# Patient Record
Sex: Female | Born: 1960 | Race: White | Hispanic: No | Marital: Married | Smoking: Never smoker
Health system: Southern US, Community
[De-identification: ages and names within clinical notes are randomized; demographics above are authoritative.]

## PROBLEM LIST (undated history)

## (undated) DIAGNOSIS — M2040 Other hammer toe(s) (acquired), unspecified foot: Secondary | ICD-10-CM

## (undated) DIAGNOSIS — J45909 Unspecified asthma, uncomplicated: Secondary | ICD-10-CM

## (undated) DIAGNOSIS — K219 Gastro-esophageal reflux disease without esophagitis: Secondary | ICD-10-CM

## (undated) DIAGNOSIS — E039 Hypothyroidism, unspecified: Secondary | ICD-10-CM

## (undated) DIAGNOSIS — M21619 Bunion of unspecified foot: Secondary | ICD-10-CM

## (undated) DIAGNOSIS — M1990 Unspecified osteoarthritis, unspecified site: Secondary | ICD-10-CM

## (undated) DIAGNOSIS — C801 Malignant (primary) neoplasm, unspecified: Secondary | ICD-10-CM

## (undated) HISTORY — PX: TOTAL KNEE ARTHROPLASTY: SHX125

## (undated) HISTORY — DX: Malignant (primary) neoplasm, unspecified: C80.1

## (undated) HISTORY — PX: BUNIONECTOMY WITH HAMMERTOE RECONSTRUCTION: SHX5600

## (undated) HISTORY — PX: ROTATOR CUFF REPAIR: SHX139

## (undated) HISTORY — PX: POLYPECTOMY: SHX149

## (undated) HISTORY — DX: Other hammer toe(s) (acquired), unspecified foot: M20.40

## (undated) HISTORY — DX: Gastro-esophageal reflux disease without esophagitis: K21.9

## (undated) HISTORY — PX: GREAT TOE ARTHRODESIS, INTERPHALANGEAL JOINT: SUR55

## (undated) HISTORY — PX: NECK SURGERY: SHX720

## (undated) HISTORY — DX: Unspecified osteoarthritis, unspecified site: M19.90

## (undated) HISTORY — PX: NASAL SINUS SURGERY: SHX719

## (undated) HISTORY — DX: Unspecified asthma, uncomplicated: J45.909

## (undated) HISTORY — DX: Bunion of unspecified foot: M21.619

## (undated) HISTORY — DX: Hypothyroidism, unspecified: E03.9

---

## 1997-11-03 ENCOUNTER — Other Ambulatory Visit: Admission: RE | Admit: 1997-11-03 | Discharge: 1997-11-03 | Payer: Self-pay | Admitting: Obstetrics and Gynecology

## 1998-11-09 ENCOUNTER — Other Ambulatory Visit: Admission: RE | Admit: 1998-11-09 | Discharge: 1998-11-09 | Payer: Self-pay | Admitting: Obstetrics and Gynecology

## 1999-05-01 ENCOUNTER — Other Ambulatory Visit: Admission: RE | Admit: 1999-05-01 | Discharge: 1999-05-01 | Payer: Self-pay | Admitting: Obstetrics and Gynecology

## 1999-07-30 ENCOUNTER — Encounter (INDEPENDENT_AMBULATORY_CARE_PROVIDER_SITE_OTHER): Payer: Self-pay

## 1999-07-30 ENCOUNTER — Other Ambulatory Visit: Admission: RE | Admit: 1999-07-30 | Discharge: 1999-07-30 | Payer: Self-pay | Admitting: Plastic Surgery

## 1999-11-21 ENCOUNTER — Emergency Department (HOSPITAL_COMMUNITY): Admission: EM | Admit: 1999-11-21 | Discharge: 1999-11-21 | Payer: Self-pay | Admitting: Emergency Medicine

## 2000-01-22 ENCOUNTER — Other Ambulatory Visit: Admission: RE | Admit: 2000-01-22 | Discharge: 2000-01-22 | Payer: Self-pay | Admitting: Obstetrics and Gynecology

## 2000-07-23 ENCOUNTER — Other Ambulatory Visit: Admission: RE | Admit: 2000-07-23 | Discharge: 2000-07-23 | Payer: Self-pay | Admitting: Obstetrics and Gynecology

## 2001-08-12 ENCOUNTER — Other Ambulatory Visit: Admission: RE | Admit: 2001-08-12 | Discharge: 2001-08-12 | Payer: Self-pay | Admitting: Obstetrics and Gynecology

## 2002-06-18 ENCOUNTER — Encounter: Admission: RE | Admit: 2002-06-18 | Discharge: 2002-06-18 | Payer: Self-pay | Admitting: Family Medicine

## 2002-06-18 ENCOUNTER — Encounter: Payer: Self-pay | Admitting: Family Medicine

## 2002-08-25 ENCOUNTER — Other Ambulatory Visit: Admission: RE | Admit: 2002-08-25 | Discharge: 2002-08-25 | Payer: Self-pay | Admitting: Obstetrics and Gynecology

## 2003-09-08 ENCOUNTER — Other Ambulatory Visit: Admission: RE | Admit: 2003-09-08 | Discharge: 2003-09-08 | Payer: Self-pay | Admitting: Obstetrics and Gynecology

## 2004-11-23 ENCOUNTER — Other Ambulatory Visit: Admission: RE | Admit: 2004-11-23 | Discharge: 2004-11-23 | Payer: Self-pay | Admitting: Obstetrics and Gynecology

## 2006-02-10 ENCOUNTER — Ambulatory Visit: Payer: Self-pay | Admitting: Internal Medicine

## 2006-04-08 ENCOUNTER — Encounter (INDEPENDENT_AMBULATORY_CARE_PROVIDER_SITE_OTHER): Payer: Self-pay | Admitting: Specialist

## 2006-04-08 ENCOUNTER — Ambulatory Visit: Payer: Self-pay | Admitting: Internal Medicine

## 2006-04-25 ENCOUNTER — Inpatient Hospital Stay (HOSPITAL_COMMUNITY): Admission: AD | Admit: 2006-04-25 | Discharge: 2006-04-25 | Payer: Self-pay | Admitting: Obstetrics and Gynecology

## 2007-04-28 ENCOUNTER — Ambulatory Visit (HOSPITAL_COMMUNITY): Admission: RE | Admit: 2007-04-28 | Discharge: 2007-04-29 | Payer: Self-pay | Admitting: Neurosurgery

## 2010-10-09 NOTE — Op Note (Signed)
NAMECALEB, PRIGMORE                ACCOUNT NO.:  [id number]   MEDICAL RECORD NO.:  [id number]          PATIENT TYPE:  OIB   LOCATION:  3025                         FACILITY:  [facility]   PHYSICIAN:  Danae Orleans. Venetia Maxon, M.D.  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   PROCEDURE:  Anterior cervical decompression and fusion, C5-6, with  allograft autograft and anterior cervical plate.   SURGEON:  Danae Orleans. Venetia Maxon, MD   ASSISTANT:  Georgiann Cocker, RN   ANESTHESIA:  General endotracheal anesthesia.   ESTIMATED BLOOD LOSS:  Minimal.   COMPLICATIONS:  None.   DISPOSITION:  To Recovery.   INDICATIONS:  [name] is a 50 year old woman with herniated  cervical disk at C5-6 with cervical spondylosis and stenosis with right  greater than left upper extremity pain.  It was elected to take her to  surgery for anterior cervical decompression and fusion at the C5-6  level.   PROCEDURE:  [name] was brought to the operating room.  Following the  satisfactory and uncomplicated induction of general endotracheal  anesthesia and placement of intravenous lines, the patient was placed in  supine position on the operating table.  Her neck was placed in slight  extension.  She was placed in 5 pounds of halter traction.  Her anterior  neck was then prepped and draped in the usual sterile fashion.  Area of  planned incision was infiltrated with 0.25% Marcaine and 0.5% lidocaine  with 1:200,0000 epinephrine.  Incision was made from the midline to the  anterior border of the sternocleidomastoid muscle on the left side of  midline just above the carotid tubercle and carried through platysmal  layer.   Subplatysmal dissection was performed, exposing the anterior  border of the sternocleidomastoid muscle using blunt dissection.  The  carotid sheath was kept lateral, trachea and esophagus kept medial,  exposing the anterior cervical spine.  A bent spinal needle was placed  at what was felt to be the C5-6 level and this was confirmed on  intraoperative x-ray.  The longus colli muscles were then taken down  from the anterior cervical spine from C5 to C6 bilaterally using  electrocautery and Key elevator and self-retaining Shadowline retractor  was placed to facilitate exposure.  The interspace was incised with a 15  blade and disk material was removed in a piecemeal fashion and ventral  osteophytes were also removed with a Leksell rongeur.  Distraction pins  were placed at C5 and C6 and using gentle distraction, the interspace  was reopened and endplates were stripped of residual disk material and  using a high-speed drill with suction trap to retain bony fragments, the  endplates were decorticated and also uncinate spurs were drilled down.  The microscope was brought into field and using microdissection  technique, the uncinate spurs were removed.  The spinal cord dura was  decompressed; both C6 nerve roots were decompressed as they extended out  the neural foramina.  Hemostasis was assured with Gelfoam soaked in  thrombin and after trial sizing, a 7-mm bone autograft wedge was  selected and fashioned with a high-speed drill and packed with the  morselized bone autograft, which had been retained at the time of  diskectomy and endplate preparation.  This was inserted in the  interspace and countersunk appropriately.  The distraction pins were  removed.  Traction weight was removed.  The 14-mm Trestle anterior  cervical plate was then affixed to the anterior cervical spine using  variable-angle 14-mm screws, 2 at C5 and 2 at C6; all screws had  excellent purchase.  Locking mechanisms were  engaged.  Final x-ray  demonstrated a well-positioned interbody graft and anterior cervical  plate without complicating features.  Hemostasis was assured.  Soft  tissues were inspected and found to be in good repair.  The platysma  layer was closed with 3-0 Vicryl sutures.  The skin edges were  approximated with 3-0 Vicryl interrupted and inverted sutures.  Wound  was dressed with Dermabond.  The patient was extubated in the operating  room and taken to the recovery room, having tolerated the procedure  well.      Danae Orleans. Venetia Maxon, M.D.  Electronically Signed     JDS/MEDQ  D:  04/28/2007  T:  04/29/2007  Job:  [id number]

## 2010-10-12 NOTE — Assessment & Plan Note (Signed)
[facility]                           GASTROENTEROLOGY OFFICE NOTE   JAMANI, ELEY                         MRN:          [id number]  DATE:02/10/2006                            DOB:          01/01/1961    HISTORY OF PRESENT ILLNESS:  Mrs. Ohms is a 50 year old white female here  at the recommendation of Dr. Arelia Sneddon for evaluation of rectal bleeding.  She  had onset of the bleeding about a year ago when becoming constipated which  lasted several months.  It finally subsided in January of this year.  She  just recently returned from a trip to [place] where she developed diarrhea,  and again took one Imodium which caused constipation.  Again, rectal  bleeding recurred.  It is bright red blood associated with fullness and  sometimes a nocturnal itching.  Usual bowel habits are quite irregular  because her eating habits have been irregular as well.  Since return from  [place] several days ago, the patient has been under a great deal of stress  and has not been eating and lost about 5 pounds.  There is no family history  of colon cancer.   MEDICATIONS:  Synthroid 1.25 mg p.o. daily.   PAST MEDICAL HISTORY:  Thyroid problems.   PAST SURGICAL HISTORY:  Knee and shoulder surgeries.   FAMILY HISTORY:  Positive for hyperthyroidism in women in her family.   SOCIAL HISTORY:  Married, having two children, one with AT and one adopted.  The patient is a homemaker.  She drinks alcohol socially.  Does not smoke.   REVIEW OF SYSTEMS:  Weight has dropped about 5 pounds in the last week.  Sleeping problems.  __________ change.  Itching.  New headaches.   PHYSICAL EXAMINATION:  VITAL SIGNS:  Blood pressure 110/74, pulse 84, weight  137 pounds, usual weight was 135 pounds.  She went up to 140 pounds before  her trip.  GENERAL:  She was alert and oriented in no distress.  SKIN: Warm and dry.  LUNGS:  Clear to auscultation.  COR:  Normal S1, S2.  ABDOMEN:  Soft  with good muscle support.  Normoactive bowel sounds.  No  tenderness.  No distention.  Right lower quadrant and left lower quadrant  were unremarkable.  RECTAL ANOSCOPIC:  Normal perianal area.  Rectal tone was normal.  There  were at least three mixed hemorrhoids within the external and internal area  right in the anal canal which were erythematous and somewhat edematous as  well, but there was no active bleeding.  Stool in the rectum appeared both  soft and hemoccult negative.  There was no evidence of proctitis and no  mucous.   IMPRESSION:  1. A 50 year old white female with first grade mixed hemorrhoids, possible      source of bleeding, although, not documented on today's endoscopic      exam.  2. Rectal bleeding, could possibly be related to hemorrhoids, but again,      it could be related to a sigmoid colon lesion.  Judging from the color  of the stool and the fact that the patient has not been anemic by      account, feel that this is most likely a distal colon occurrence rather      than a proximal colon problem.   PLAN:  I will treat the hemorrhoids with Anusol suppositories and Analpram  HC.  In view of her age of 50, and recurrence of rectal bleeding, I would  recommend full colonoscopic exam.  This will be done after the treatment of  hemorrhoids.  She will prep with MiraLax prep.  We have discussed culture  sedation as well as the procedure itself.                                   Hedwig Morton. Juanda Chance, MD   DMB/MedQ  DD:  02/10/2006  DT:  02/11/2006  Job #:  [id number]   cc:   Juluis Mire, M.D.  Stacie Acres Cliffton Asters, M.D.

## 2010-10-23 ENCOUNTER — Other Ambulatory Visit: Payer: Self-pay | Admitting: Family Medicine

## 2010-10-23 ENCOUNTER — Ambulatory Visit
Admission: RE | Admit: 2010-10-23 | Discharge: 2010-10-23 | Disposition: A | Discharge status: Home / Self Care | Payer: BC Managed Care – PPO | Source: Ambulatory Visit | Attending: Family Medicine | Admitting: Family Medicine

## 2010-10-23 DIAGNOSIS — M79669 Pain in unspecified lower leg: Secondary | ICD-10-CM

## 2010-10-24 ENCOUNTER — Encounter: Payer: Self-pay | Admitting: Podiatry

## 2011-02-06 ENCOUNTER — Other Ambulatory Visit: Payer: Self-pay | Admitting: Dermatology

## 2011-02-15 ENCOUNTER — Other Ambulatory Visit: Payer: Self-pay | Admitting: Orthopedic Surgery

## 2011-02-15 ENCOUNTER — Encounter (HOSPITAL_COMMUNITY): Payer: BC Managed Care – PPO

## 2011-02-15 LAB — APTT: aPTT: 27 seconds (ref 24–37)

## 2011-02-15 LAB — URINALYSIS, ROUTINE W REFLEX MICROSCOPIC
Nitrite: NEGATIVE
Specific Gravity, Urine: 1.025 (ref 1.005–1.030)
Urobilinogen, UA: 0.2 mg/dL (ref 0.0–1.0)
pH: 5.5 (ref 5.0–8.0)

## 2011-02-15 LAB — CBC
MCH: 32.2 pg (ref 26.0–34.0)
MCHC: 34.3 g/dL (ref 30.0–36.0)
Platelets: 246 10*3/uL (ref 150–400)
RDW: 13 % (ref 11.5–15.5)

## 2011-02-15 LAB — COMPREHENSIVE METABOLIC PANEL
ALT: 14 U/L (ref 0–35)
AST: 17 U/L (ref 0–37)
Calcium: 10 mg/dL (ref 8.4–10.5)
GFR calc Af Amer: >60 mL/min (ref >60)
Sodium: 137 mEq/L (ref 135–145)
Total Protein: 7.5 g/dL (ref 6.0–8.3)

## 2011-02-15 LAB — SURGICAL PCR SCREEN: MRSA, PCR: NEGATIVE

## 2011-02-15 LAB — URINE MICROSCOPIC-ADD ON

## 2011-02-20 ENCOUNTER — Inpatient Hospital Stay (HOSPITAL_COMMUNITY)
Admission: RE | Admit: 2011-02-20 | Discharge: 2011-02-27 | DRG: 209 | Disposition: A | Discharge status: Home Health | Payer: BC Managed Care – PPO | Source: Ambulatory Visit | Attending: Orthopedic Surgery | Admitting: Orthopedic Surgery

## 2011-02-20 DIAGNOSIS — E039 Hypothyroidism, unspecified: Secondary | ICD-10-CM | POA: Diagnosis present

## 2011-02-20 DIAGNOSIS — D62 Acute posthemorrhagic anemia: Secondary | ICD-10-CM | POA: Diagnosis not present

## 2011-02-20 DIAGNOSIS — Z01812 Encounter for preprocedural laboratory examination: Secondary | ICD-10-CM

## 2011-02-20 DIAGNOSIS — Z87440 Personal history of urinary (tract) infections: Secondary | ICD-10-CM

## 2011-02-20 DIAGNOSIS — M76899 Other specified enthesopathies of unspecified lower limb, excluding foot: Secondary | ICD-10-CM | POA: Diagnosis present

## 2011-02-20 DIAGNOSIS — M62838 Other muscle spasm: Secondary | ICD-10-CM | POA: Diagnosis not present

## 2011-02-20 DIAGNOSIS — Z742 Need for assistance at home and no other household member able to render care: Secondary | ICD-10-CM

## 2011-02-20 DIAGNOSIS — N390 Urinary tract infection, site not specified: Secondary | ICD-10-CM | POA: Diagnosis present

## 2011-02-20 DIAGNOSIS — M1710 Unilateral primary osteoarthritis, unspecified knee: Principal | ICD-10-CM | POA: Diagnosis present

## 2011-02-20 LAB — ABO/RH: ABO/RH(D): B POS

## 2011-02-20 LAB — TYPE AND SCREEN
ABO/RH(D): B POS
Antibody Screen: NEGATIVE

## 2011-02-21 DIAGNOSIS — M1710 Unilateral primary osteoarthritis, unspecified knee: Secondary | ICD-10-CM

## 2011-02-21 DIAGNOSIS — Z96659 Presence of unspecified artificial knee joint: Secondary | ICD-10-CM

## 2011-02-21 LAB — CBC
HCT: 31.6 % — ABNORMAL LOW (ref 36.0–46.0)
Hemoglobin: 10.8 g/dL — ABNORMAL LOW (ref 12.0–15.0)
MCV: 93.2 fL (ref 78.0–100.0)
RBC: 3.39 MIL/uL — ABNORMAL LOW (ref 3.87–5.11)
RDW: 13.1 % (ref 11.5–15.5)
WBC: 9.3 10*3/uL (ref 4.0–10.5)

## 2011-02-21 LAB — BASIC METABOLIC PANEL
BUN: 4 mg/dL — ABNORMAL LOW (ref 6–23)
CO2: 29 mEq/L (ref 19–32)
Chloride: 103 mEq/L (ref 96–112)
Creatinine, Ser: 0.49 mg/dL — ABNORMAL LOW (ref 0.50–1.10)
GFR calc Af Amer: >60 mL/min (ref >60)
Potassium: 4.1 mEq/L (ref 3.5–5.1)

## 2011-02-22 LAB — CBC
HCT: 28.5 % — ABNORMAL LOW (ref 36.0–46.0)
Hemoglobin: 9.6 g/dL — ABNORMAL LOW (ref 12.0–15.0)
MCHC: 33.7 g/dL (ref 30.0–36.0)
MCV: 94.7 fL (ref 78.0–100.0)
RDW: 13.1 % (ref 11.5–15.5)
WBC: 8.7 10*3/uL (ref 4.0–10.5)

## 2011-02-22 LAB — BASIC METABOLIC PANEL
BUN: 5 mg/dL — ABNORMAL LOW (ref 6–23)
Chloride: 101 mEq/L (ref 96–112)
Creatinine, Ser: 0.52 mg/dL (ref 0.50–1.10)
Glucose, Bld: 105 mg/dL — ABNORMAL HIGH (ref 70–99)
Potassium: 3.7 mEq/L (ref 3.5–5.1)

## 2011-02-23 LAB — CBC
HCT: 29.5 % — ABNORMAL LOW (ref 36.0–46.0)
Hemoglobin: 10 g/dL — ABNORMAL LOW (ref 12.0–15.0)
MCH: 31.9 pg (ref 26.0–34.0)
MCHC: 33.9 g/dL (ref 30.0–36.0)
RDW: 13 % (ref 11.5–15.5)

## 2011-03-05 LAB — BASIC METABOLIC PANEL
BUN: 11
CO2: 25
Calcium: 8.9
Creatinine, Ser: 0.65
GFR calc non Af Amer: >60
Glucose, Bld: 87

## 2011-03-05 LAB — CBC
MCHC: 34
Platelets: 237
RDW: 13.4

## 2011-03-06 NOTE — Discharge Summary (Signed)
[name], [name]                ACCOUNT NO.:  [id number]  MEDICAL RECORD NO.:  [id number]  LOCATION:  1620                         FACILITY:  [facility]  PHYSICIAN:  Ollen Gross, M.D.    DATE OF BIRTH:  [birth year]  DATE OF ADMISSION:  02/20/2011 DATE OF DISCHARGE:  02/26/2011                        DISCHARGE SUMMARY - REFERRING   ADMITTING DIAGNOSES: 1. Osteoarthritis, right knee. 2. Hypothyroidism. 3. Past history of urinary tract infections.  DISCHARGE DIAGNOSES: 1. Osteoarthritis, right knee, status post right total knee     replacement arthroplasty. 2. Mild postoperative acute blood loss anemia, did not require     transfusion. 3. Hypothyroidism. 4. Past history of urinary tract infections.  PROCEDURE:  February 20, 2011, right total knee; surgeon Dr. Lequita Halt; assistant  Avel Peace PA-C;  spinal anesthesia; tourniquet time 45 minutes.  CONSULTS:  [facility].  BRIEF HISTORY:  The patient is a 50 year old female with advanced arthritis of the right knee, progressive worsening pain and dysfunction. She had a previous knee scope and also previous injections multiple times.  She has intractable pain limiting the ability to carry on daily function.  She has bone-on-bone throughout and now presents for total knee.  LABORATORY DATA:  Preop CBC showed hemoglobin of 14.1, hematocrit of 41.4, white cell count 7.8, platelets 246.  PT/INR 13.2 and 0.98 with a PTT of 27.  Chem panel on admission all within normal limits.  Preop UA showed trace ketones, trace blood, small leukocytes, mini squamous, 3-6 white cells, 3-6 red cells, few bacteria.  Blood group type B+.  Nasal swabs were positive for Staph aureus, but negative for MRSA.  Serial CBCs were followed.  Hemoglobin dropped down to 10.8, got as low as 9.6, however stabilized, came back up a little bit.  Last H and H 10.0 and 29.5.  Serial BMETs were followed for 48 hours.  Electrolytes  remained within normal limits.  EKG dated December 19, 2010:  Sinus bradycardia, normal P-axis, heart rate 51, negative precordial T-waves; within normal limits.  HOSPITAL COURSE:  The patient admitted to [facility], taken to OR, underwent above-stated procedure without complication.  The patient tolerated the procedure well, later transferred to recovery room in orthopedic floor, started on p.o. and IV analgesic for pain control following surgery, given 24 hours of postop IV antibiotics.  She was started on Xarelto for DVT prophylaxis.  She had originally looked into going to some type of inpatient rehab facility following her hospital course.  She had spoken with her own personal insurance company preoperatively and they told her personally that the only facility at that time they would approve would be the [facility], so we got a consult for them immediately postoperatively.  They did see her.  On postop day 1, felt that she was already progressing pretty well from rehab potential.  We also got the social work involved just in case if inpatient rehab proved to be not an option.  She was initially placed on p.o. and IV analgesics, weaned over to p.o. medications.  By day 2, she was having moderate to severe pain.  Her pain increased.  This was due to  the fact that she did a fair amount of walking on day 1 with increased pain.  We did check the dressing and changed it.  On day 2, her incision was looking excellent, hemoglobin was down to 9.6 but she was asymptomatic as far as her blood pressure and pulse.  There is mainly pain control issues.  By day 3 and on the weekend on Saturday, she was doing well and originally, she was set up to go to [facility], but due to insurance issues, she was declined even though they said initially preop that they would approve since she was progressing with her therapy.  Due to extenuating circumstances though she was  not able to go home, her husband is currently being worked up and treated for at this time an inoperable pancreatic cancer, but may require surgery if it becomes operable.  She does not have support and cannot go home to an independent situation yet.  Therefore, we kept her in over the weekend on Saturday and Sunday.  She received therapy each day and was followed by the weekend orthopedic covering services covering for Dr. Lequita Halt.  She was seen back on rounds on Monday on October 1.  She was still having a fair amount of pain in the knee and also up in the thigh, which was from the tourniquet during surgery.  We are still waiting on see if there was reconsideration of the inpatient rehab and we also had started looking into other skilled facilities. Again, she was not complete 100% and not able to go home to a good support with extenuating circumstances of her husband.  She was kept in and she was seen on rounds on October 2 by Dr. Lequita Halt, which was postop day 6.  Incision was healing well.  We are still waiting on insurance approval for reconsideration due to the extenuating circumstances and also her pending disposition.  It did not appear that the [facility] inpatient rehab will be an option, so we are looking into other skilled facilities.  DISCHARGE/PLAN:  Possible discharge day on February 26, 2011, pending insurance approval due to extenuating circumstances of family support.  DISCHARGE DIAGNOSES:  Please see above.  DISCHARGE MEDICATIONS:  Current medications at time of dictation include: 1. Levothyroxine 125 mcg p.o. q.a.m. 2. Xarelto 10 mg p.o. daily for 15 more days, then discontinue the     Xarelto. 3. Colace 100 mg p.o. b.i.d. 4. Nu-Iron 150 mg p.o. daily for 3 weeks and discontinue the Nu-Iron. 5. Valtrex 500 mg p.o. daily p.r.n. blisters. 6. Robaxin 500 mg p.o. q.6-8 hours p.r.n. spasm. 7. Restoril 15-30 mg p.o. q.h.s. p.r.n. sleep. 8. OxyIR 5 mg one or two every 4 hours  as needed for moderate pain. 9. Tylenol 325 one or two every 4-6 hours as needed for mild pain,     temperature, or headache.  DIET:  As tolerated.  ACTIVITY:  She is weightbearing as tolerated, total knee protocol.  PT and OT for gait training, ambulation, ADLs, range of motion and strengthening exercises.  Please note she may start showering, however, do not submerge the incision under water.  FOLLOWUP:  She needs followup with Dr. Lequita Halt in approximately 2 weeks from date of surgery.  Please contact the office at [phone] to help arrange appointment for followup care of this patient.  DISPOSITION:  Pending at time of dictation.  CONDITION UPON DISCHARGE:  She is improving at the time of dictation.     Alexzandrew L. Julien Girt,  P.A.C.   ______________________________ Ollen Gross, M.D.    ALP/MEDQ  D:  02/26/2011  T:  02/26/2011  Job:  [id number]  cc:   Stacie Acres. Cliffton Asters, M.D. Fax: [phone]  Skilled Nursing Facility  Electronically Signed by Patrica Duel P.A.C. on 02/28/2011 10:58:07 AM Electronically Signed by Ollen Gross M.D. on 03/06/2011 11:19:25 AM

## 2011-03-06 NOTE — Op Note (Signed)
[name], Shelly NO.:  [name]  MEDICAL RECORD NO.:  [id number]  LOCATION:  1616                         FACILITY:  [facility]  PHYSICIAN:  Ollen Gross, M.D.    DATE OF BIRTH:  March 05, 1961  DATE OF PROCEDURE:  02/20/2011 DATE OF DISCHARGE:                              OPERATIVE REPORT   PREOPERATIVE DIAGNOSIS:  Osteoarthritis right knee.  POSTOPERATIVE DIAGNOSIS:  Osteoarthritis right knee.  PROCEDURE:  Right total knee arthroplasty.  SURGEON:  Ollen Gross, M.D.  ASSISTANT:  Alexzandrew L. Perkins, P.A.C.  ANESTHESIA:  Spinal.  ESTIMATED BLOOD LOSS:  Minimal.  DRAINS:  Hemovac x1.  TOURNIQUET TIME:  45 minutes at 300 mmHg.  COMPLICATIONS:  None.  CONDITION:  Stable to Recovery.  INDICATIONS:  [name] is a 50 year old female who has advanced end-stage arthritis of the right knee with progressively worsening pain and dysfunction.  She has had a previous arthroscopy as well as previous injections and multiple times.  Unfortunately, pain is intractable now and is limiting her ability to do things.  She has bone-on-bone changes throughout the knee.  She presents now for right total knee arthroplasty.  PROCEDURE IN DETAIL:  After successful administration of spinal anesthetic, a tourniquet was placed high on her right thigh and her right lower extremity, was prepped and draped in usual sterile fashion. Extremities wrapped in Esmarch, knee flexed, tourniquet inflated to 300 mmHg.  Midline incision was made with a 10 blade through a subcutaneous tissue to the level of the extensor mechanism.  A fresh blade was used make a medial parapatellar arthrotomy.  Soft tissue on the proximal medial tibia subperiosteally elevated to the joint line with the knife into the semimembranosus bursa with a Cobb elevator.  Soft tissue laterally is elevated with attention being paid to avoid patellar tendon on tibial tubercle.  The patella was everted, knee flexed  to 90 degrees and ACL and PCL removed.  Drill was used to create a starting hole in the distal femur and the canal was thoroughly irrigated.  She has bone- on-bone change in the mediolateral and patellofemoral compartments.  The 5-degree right valgus alignment guide was then placed into the femoral canal and distal femoral cutting block is pinned to remove a 11 mm off the distal femur.  Resection was made with an oscillating saw.  It took 11 because of a preop flexion and contracture.  The tibia was then subluxed forward and the menisci removed. Extramedullary tibial alignment guide was placed referencing proximally to the medial aspect of the tibial tubercle and distally along the second metatarsal axis and tibial crest.  The block was pinned to remove 2 mm off the more deficient medial side.  Tibial resection was made with an oscillating saw.  Size #3 is the most appropriate tibial component and proximal tibia was prepared to a modular drill and keel punch for the size #3.  Femoral sizing guide was placed and size #4 and narrow was most appropriate for the femur.  The rotation was marked the epicondylar axis and confirmed by creating rectangular flexion gap at 90 degrees.  The block is pinned in this rotation and the anteroposterior and chamfer cuts  were made.  The intercondylar block is placed and that cut was made.  Trial size #4 narrow posterior stabilized femur was then placed. A 10-mm posterior stabilized rotating platform insert trial was placed. With the 10, full extension was achieved with excellent varus-valgus and anterior and posterior balance throughout full range of motion.  The patella was everted and the thickness measured to 22 mm.  Freehand resection was taken at 12 mm, 35 template is placed, lug holes were drilled, trial patella was placed and it tracks normally.  Osteophytes were removed off the posterior femur with the trial in place.  All trials were removed and  the cut bone surfaces were prepared with pulsatile lavage.  Cement was mixed and once ready for implantation, the size #3 mobile bearing tibial tray, size #4 narrow posterior stabilized femur and 35 patella were cemented in place.  The patella was held with a clamp.  Trial 10-mm inserts were placed, knee held in full extension, all extruded cement removed.  The cement was fully hardened and the permanent 10-mm posterior stabilized rotating platform insert was placed into the tibial tray.  The wound was copiously irrigated with saline solution and the arthrotomy closed over Hemovac drain with interrupted #1 PDS.  Flexion against gravity was 140 degrees and the patella tracks normally.  Tourniquet was released a total time of 45 minutes.  Subcu was closed with interrupted 2-0 Vicryl and subcuticular running 4-0 Monocryl.  Catheter for Marcaine pain pump was placed and pumps initiated.  Incisions cleaned and dried and Steri-Strips and a bulky sterile dressing were applied.  She was then placed into a knee immobilizer, awakened, and transported to Recovery in stable condition.  Please note that a surgical assistant was a medical necessity in order to allow for the safe and expeditious performance of this procedure. Surgical assistant was necessary for safe retraction of the ligaments and neurovascular structures as well as for proper positioning of the leg to allow for anatomic placement of the prosthetic components.     Ollen Gross, M.D.     FA/MEDQ  D:  02/20/2011  T:  02/21/2011  Job:  [id number]  Electronically Signed by Ollen Gross M.D. on 03/06/2011 11:19:20 AM

## 2011-03-06 NOTE — H&P (Signed)
[name], [name] NO.:  [id number]  MEDICAL RECORD NO.:  [id number]  LOCATION:  1616                         FACILITY:  [facility]  PHYSICIAN:  Ollen Gross, M.D.    DATE OF BIRTH:  1960/09/26  DATE OF ADMISSION:  02/20/2011 DATE OF DISCHARGE:                             HISTORY & PHYSICAL   CHIEF COMPLAINT:  Right knee pain.  HISTORY OF PRESENT ILLNESS:  The patient is a 50 year old female who has been seen by Dr. Lequita Halt for ongoing complaints of right knee.  She had problems with her knees for over a year or two now.  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  It has started to impact her mobility and her functions, especially as a Careers information officer.  She has had increasing pain.  She has had injections in the past, which have only provided temporary relief.  Her x-rays now at the office show that she has already developed bone-on-bone in the medial and patellofemoral compartments. These have progressed and now are end stage with significant pain.  It is felt she would benefit from undergoing surgical intervention.  Risks and benefits have been discussed.  She elected to proceed with surgery.  ALLERGIES:  No known drug allergies.  CURRENT MEDICATIONS:  Synthroid.  PAST MEDICAL HISTORY: 1. Hypothyroidism. 2. Also past history of urinary tract infections.  PAST SURGICAL HISTORY:  Neck surgery in 2009, knee surgery almost 15 years ago, shoulder surgery about 10 years ago, bilateral foot surgery back in 2011, cesarean section x2.  FAMILY HISTORY:  Father deceased.  Mother with emphysema.  SOCIAL HISTORY:  She is currently separated.  Nonsmoker.  3-4 drinks per week.  She lives alone.  She does want to look into a skilled rehab facility, possibly [facility].  REVIEW OF SYSTEMS:  GENERAL:  No fever, chills, or night sweats.  NEURO: No seizure, syncope, or paralysis.  RESPIRATORY:  No shortness of breath, productive cough, or hemoptysis.  CARDIOVASCULAR:  No  chest pain, angina, orthopnea.  GI:  No nausea, vomiting, diarrhea, or constipation.  GU:  No dysuria, hematuria, or discharge. MUSCULOSKELETAL:  Knee pain.  PHYSICAL EXAMINATION:  VITAL SIGNS:  Pulse 76, respirations 12, blood pressure 138/82. GENERAL:  A 50 year old white female, well nourished, well developed, tall, slender frame.  She is alert, oriented, cooperative, pleasant, excellent historian. HEENT:  Normocephalic, atraumatic.  Pupils are round and reactive.  EOMs intact. NECK:  Supple. CHEST:  Clear. HEART:  Regular rate and rhythm without murmur.  S1, S2 noted. ABDOMEN:  Soft, nontender.  Bowel sounds present. RECTAL/BREASTS/GENITALIA:  Not done, not pertinent to present illness. EXTREMITIES:  Right knee, no effusion, range of motion 10-135, moderate crepitus, tender more medial than lateral.  IMPRESSION:  Osteoarthritis, right knee.  PLAN:  The patient admitted to [facility] to undergo right total knee replacement arthroplasty.  Surgery will be performed by Dr. Ollen Gross.  Alexzandrew L. Julien Girt, P.A.C.   ______________________________ Ollen Gross, M.D.    ALP/MEDQ  D:  02/20/2011  T:  02/21/2011  Job:  [id number]  cc:   Stacie Acres. Cliffton Asters, M.D. Fax: [phone]  Electronically Signed by Patrica Duel P.A.C. on 02/21/2011 11:21:04  AM Electronically Signed by Ollen Gross M.D. on 03/06/2011 11:19:22 AM

## 2011-03-06 NOTE — Op Note (Signed)
  NAMEZAMERIA, VOGL NO.:  [id number]  MEDICAL RECORD NO.:  [id number]  LOCATION:  1616                         FACILITY:  [facility]  PHYSICIAN:  Ollen Gross, M.D.    DATE OF BIRTH:  1961/01/17  DATE OF PROCEDURE: DATE OF DISCHARGE:                              OPERATIVE REPORT   ADDENDUM:  In addition, [name] has a diagnosis of left hip trochanteric bursitis.  At the completion of the procedure, after sterile prep with Betadine, I injected the left trochanteric bursa with 80 mg of Depo- Medrol with no problems.  We then cleaned up the Betadine and placed a Band-Aid without difficulty.     Ollen Gross, M.D.     FA/MEDQ  D:  02/20/2011  T:  02/21/2011  Job:  [id number]  Electronically Signed by Ollen Gross M.D. on 03/06/2011 11:19:16 AM

## 2011-03-20 NOTE — Discharge Summary (Signed)
  [name], [name]                ACCOUNT NO.:  [id number]  MEDICAL RECORD NO.:  [id number]  LOCATION:  1620                         FACILITY:  [facility]  PHYSICIAN:  Alexzandrew L. Perkins, P.A.C.DATE OF BIRTH:  February 20, 1961  DATE OF ADMISSION:  02/20/2011 DATE OF DISCHARGE:  02/27/2011                              DISCHARGE SUMMARY   ADDENDUM:  ADMITTING DISCHARGE DIAGNOSES:  Please see previous discharge summary.  PROCEDURE:  Please see previous discharge summary.  BRIEF HISTORY:  Please see previous discharge summary.  LABORATORY DATA:  Please see previous discharge summary.  HOSPITAL COURSE:  Originally, we had set the patient up to go to either skilled facility or [facility] inpatient rehab on February 26, 2011, however due to insurance issues and essentially insurance denial, she was unable to go to any facility.  She had to make arrangements with a friend to go home with them.  She was seen in rounds on February 27, 2011 by Dr.Ann-Marie Kluge, doing fine, progressing and was discharged home at that time.  DISCHARGE PLANS:  As per previously dictated summary.  MEDICATIONS:  As per previously dictated summary.  DIET:  As tolerated.  ACTIVITY:  As per previous summary.  FOLLOWUP:  She is going to follow up next Thursday, which is October 11; have the patient call the office for an appointment at [phone].  DISPOSITION:  She now is going home with a friend.  CONDITION UPON DISCHARGE:  Improving.    Alexzandrew L. Perkins, P.A.C.    ALP/MEDQ  D:  02/27/2011  T:  02/27/2011  Job:  [id number]  Electronically Signed by Patrica Duel P.A.C. on 02/28/2011 10:58:17 AM Electronically Signed by Ollen Gross M.D. on 03/20/2011 11:22:07 AM

## 2011-05-23 ENCOUNTER — Encounter: Payer: Self-pay | Admitting: Internal Medicine

## 2011-07-17 ENCOUNTER — Encounter: Payer: Self-pay | Admitting: Internal Medicine

## 2011-08-14 ENCOUNTER — Telehealth: Payer: Self-pay | Admitting: *Deleted

## 2011-08-14 ENCOUNTER — Encounter: Payer: Self-pay | Admitting: Internal Medicine

## 2011-08-14 ENCOUNTER — Ambulatory Visit (AMBULATORY_SURGERY_CENTER): Payer: BC Managed Care – PPO | Admitting: *Deleted

## 2011-08-14 VITALS — Ht 67.5 in | Wt 130.5 lb

## 2011-08-14 DIAGNOSIS — Z86010 Personal history of colonic polyps: Secondary | ICD-10-CM

## 2011-08-14 DIAGNOSIS — Z1211 Encounter for screening for malignant neoplasm of colon: Secondary | ICD-10-CM

## 2011-08-14 MED ORDER — SOD PHOS MONO-SOD PHOS DIBASIC 1.102-0.398 G PO TABS: ORAL_TABLET | ORAL | Status: DC

## 2011-08-14 NOTE — Telephone Encounter (Signed)
Please give Osmoprep as per pt's request. He will sign the paper.

## 2011-08-14 NOTE — Progress Notes (Signed)
Pt tells Clinical research associate, "I have the weakest stomach ever.  I can't even take Alka-Selzer.  I took the pills the last time and I really want to take them again."  Writer explained the Moviprep that is used now and how Osmoprep is not used due to possible kidney damage. Pt states, "There is absolutely no way I will be able to drink that fluid.  I'm sure that this one dose of the pills will not hurt me.  That's what I want to do."  Suprep shown to pt and pt still states, "I can't drink that.  I can only take the pills."  Osmoprep instructions given per pt request and pt encouraged to drink as much fluid as possible that day.  Understanding voiced.

## 2011-08-14 NOTE — Telephone Encounter (Signed)
Dr. Juanda Chance,  This is an FYI.  This pt had her PV this morning.  [name]  Pt tells Clinical research associate, "I have the weakest stomach ever.  I can't even take Alka-Selzer.  I took the pills the last time and I really want to take them again."  Writer explained the Moviprep that is used now and how Osmoprep is not used due to possible kidney damage. Pt states, "There is absolutely no way I will be able to drink that fluid.  I'm sure that this one dose of the pills will not hurt me.  That's what I want to do."  Suprep shown to pt and pt still states, "I can't drink that.  I can only take the pills."  Osmoprep instructions given per pt request and pt encouraged to drink as much fluid as possible that day.  Understanding voiced.

## 2011-08-28 ENCOUNTER — Encounter: Payer: Self-pay | Admitting: Internal Medicine

## 2011-08-28 ENCOUNTER — Ambulatory Visit (AMBULATORY_SURGERY_CENTER): Payer: BC Managed Care – PPO | Admitting: Internal Medicine

## 2011-08-28 ENCOUNTER — Encounter: Payer: BC Managed Care – PPO | Admitting: Internal Medicine

## 2011-08-28 VITALS — BP 114/70 | HR 55 | Temp 96.0°F | Resp 20 | Ht 67.0 in | Wt 130.0 lb

## 2011-08-28 DIAGNOSIS — Z86010 Personal history of colonic polyps: Secondary | ICD-10-CM

## 2011-08-28 DIAGNOSIS — Z1211 Encounter for screening for malignant neoplasm of colon: Secondary | ICD-10-CM

## 2011-08-28 HISTORY — PX: COLONOSCOPY: SHX174

## 2011-08-28 MED ORDER — SODIUM CHLORIDE 0.9 % IV SOLN: 500.0000 mL | INTRAVENOUS | Status: DC

## 2011-08-28 NOTE — Patient Instructions (Signed)
YOU HAD AN ENDOSCOPIC PROCEDURE TODAY AT THE [facility]: Refer to the procedure report that was given to you for any specific questions about what was found during the examination.  If the procedure report does not answer your questions, please call your gastroenterologist to clarify.  If you requested that your care partner not be given the details of your procedure findings, then the procedure report has been included in a sealed envelope for you to review at your convenience later.  YOU SHOULD EXPECT: Some feelings of bloating in the abdomen. Passage of more gas than usual.  Walking can help get rid of the air that was put into your GI tract during the procedure and reduce the bloating. If you had a lower endoscopy (such as a colonoscopy or flexible sigmoidoscopy) you may notice spotting of blood in your stool or on the toilet paper. If you underwent a bowel prep for your procedure, then you may not have a normal bowel movement for a few days.  DIET: Your first meal following the procedure should be a light meal and then it is ok to progress to your normal diet.  A half-sandwich or bowl of soup is an example of a good first meal.  Heavy or fried foods are harder to digest and may make you feel nauseous or bloated.  Likewise meals heavy in dairy and vegetables can cause extra gas to form and this can also increase the bloating.  Drink plenty of fluids but you should avoid alcoholic beverages for 24 hours.  ACTIVITY: Your care partner should take you home directly after the procedure.  You should plan to take it easy, moving slowly for the rest of the day.  You can resume normal activity the day after the procedure however you should NOT DRIVE or use heavy machinery for 24 hours (because of the sedation medicines used during the test).    SYMPTOMS TO REPORT IMMEDIATELY: A gastroenterologist can be reached at any hour.  During normal business hours, 8:30 AM to 5:00 PM Monday through Friday,  call [phone].  After hours and on weekends, please call the GI answering service at [phone] who will take a message and have the physician on call contact you.   Following lower endoscopy (colonoscopy or flexible sigmoidoscopy):  Excessive amounts of blood in the stool  Significant tenderness or worsening of abdominal pains  Swelling of the abdomen that is new, acute  Fever of 100F or higher    FOLLOW UP: If any biopsies were taken you will be contacted by phone or by letter within the next 1-3 weeks.  Call your gastroenterologist if you have not heard about the biopsies in 3 weeks.  Our staff will call the home number listed on your records the next business day following your procedure to check on you and address any questions or concerns that you may have at that time regarding the information given to you following your procedure. This is a courtesy call and so if there is no answer at the home number and we have not heard from you through the emergency physician on call, we will assume that you have returned to your regular daily activities without incident.  SIGNATURES/CONFIDENTIALITY: You and/or your care partner have signed paperwork which will be entered into your electronic medical record.  These signatures attest to the fact that that the information above on your After Visit Summary has been reviewed and is understood.  Full responsibility of the confidentiality   of this discharge information lies with you and/or your care-partner.     

## 2011-08-28 NOTE — Progress Notes (Signed)
The pt tolerated the colonoscopy very well. Maw   

## 2011-08-28 NOTE — Op Note (Signed)
[facility] [address] [place], [place]  [address]  COLONOSCOPY PROCEDURE REPORT  PATIENT:  Riki, Berninger  MR#:  [id number] BIRTHDATE:  09/29/1960, 51 yrs. old  GENDER:  female ENDOSCOPIST:  Hedwig Morton. Juanda Chance, MD REF. BY:  Laurann Montana, M.D. PROCEDURE DATE:  08/28/2011 PROCEDURE:  Colonoscopy 19147 ASA CLASS:  Class I INDICATIONS:  history of pre-cancerous (adenomatous) colon polyps tub. adenoma 03/2006 MEDICATIONS:   MAC sedation, administered by CRNA, propofol (Diprivan) 300 mg  DESCRIPTION OF PROCEDURE:   After the risks and benefits and of the procedure were explained, informed consent was obtained. Digital rectal exam was performed and revealed no rectal masses. The LB PCF-H180AL [id number] endoscope was introduced through the anus and advanced to the cecum, which was identified by both the appendix and ileocecal valve.  The quality of the prep was Moviprep fair.  The instrument was then slowly withdrawn as the colon was fully examined. <<PROCEDUREIMAGES>>  FINDINGS:  No polyps or cancers were seen (see image1, image2, image3, and image4).   Retroflexed views in the rectum revealed no abnormalities.    The scope was then withdrawn from the patient and the procedure completed.  COMPLICATIONS:  None ENDOSCOPIC IMPRESSION: 1) No polyps or cancers 2) Normal colonoscopy suboptimal prep RECOMMENDATIONS: 1) High fiber diet.  REPEAT EXAM:  In 7 year(s) for.  ______________________________ Hedwig Morton. Juanda Chance, MD  CC:  n. eSIGNED:   Hedwig Morton. Laira Penninger at 08/28/2011 09:23 AM  Marcene Duos, [phone]

## 2011-08-28 NOTE — Progress Notes (Signed)
Patient did not experience any of the following events: a burn prior to discharge; a fall within the facility; wrong site/side/patient/procedure/implant event; or a hospital transfer or hospital admission upon discharge from the facility. (G8907) Patient did not have preoperative order for IV antibiotic SSI prophylaxis. (G8918)  

## 2011-08-29 ENCOUNTER — Telehealth: Payer: Self-pay | Admitting: *Deleted

## 2011-08-29 NOTE — Telephone Encounter (Signed)
  Follow up Call-  Call back number 08/28/2011  Post procedure Call Back phone  # [phone]  Permission to leave phone message Yes     Patient questions:  Do you have a fever, pain , or abdominal swelling? no Pain Score  0 *  Have you tolerated food without any problems? yes  Have you been able to return to your normal activities? yes  Do you have any questions about your discharge instructions: Diet   no Medications  no Follow up visit  no  Do you have questions or concerns about your Care? no  Actions: * If pain score is 4 or above: No action needed, pain <4.

## 2012-11-04 ENCOUNTER — Emergency Department (HOSPITAL_COMMUNITY)
Admission: EM | Admit: 2012-11-04 | Discharge: 2012-11-04 | Disposition: A | Discharge status: Home / Self Care | Payer: BC Managed Care – PPO | Source: Home / Self Care

## 2012-11-04 ENCOUNTER — Encounter (HOSPITAL_COMMUNITY): Payer: Self-pay | Admitting: Emergency Medicine

## 2012-11-04 DIAGNOSIS — J40 Bronchitis, not specified as acute or chronic: Secondary | ICD-10-CM

## 2012-11-04 DIAGNOSIS — J309 Allergic rhinitis, unspecified: Secondary | ICD-10-CM

## 2012-11-04 LAB — POCT RAPID STREP A: Streptococcus, Group A Screen (Direct): NEGATIVE

## 2012-11-04 MED ORDER — LEVOFLOXACIN 500 MG PO TABS: 500.0000 mg | ORAL_TABLET | Freq: Every day | ORAL | Status: DC

## 2012-11-04 MED ORDER — FLUTICASONE PROPIONATE 50 MCG/ACT NA SUSP: 2.0000 | Freq: Every day | NASAL | Status: AC

## 2012-11-04 MED ORDER — ALBUTEROL SULFATE HFA 108 (90 BASE) MCG/ACT IN AERS: 2.0000 | INHALATION_SPRAY | Freq: Four times a day (QID) | RESPIRATORY_TRACT | Status: DC | PRN

## 2012-11-04 NOTE — ED Provider Notes (Signed)
History     CSN: [id number]  Arrival date & time 11/04/12  1001   None     Chief Complaint  Patient presents with  . URI    (Consider location/radiation/quality/duration/timing/severity/associated sxs/prior treatment) Patient is a 52 y.o. female presenting with URI.  URI Presenting symptoms: congestion, cough, rhinorrhea and sore throat   Associated symptoms: sneezing    This is a 52 year old female who has had symptoms of runny nose sneezing and cough on and off for the past 3 months. Lately the cough has become more severe and is associated with mild wheezing and shortness of breath and therefore she presented to urgent care. She states that she is coughing up greenish colored mucus. She has not had any fevers. He has tried Zyrtec and Sudafed without any significant improvement.  Past Medical History  Diagnosis Date  . Bunion   . Hammer toe   . Arthritis   . Thyroid disease     hypothyroid    Past Surgical History  Procedure Laterality Date  . Colonoscopy    . Polypectomy    . Cesarean section      x2  . Total knee arthroplasty      right  . Neck surgery      plates and screws in  . Rotator cuff repair      right  . Foot surgery      both    Family History  Problem Relation Age of Onset  . Colon cancer Neg Hx   . Esophageal cancer Neg Hx   . Stomach cancer Neg Hx   . Rectal cancer Neg Hx     History  Substance Use Topics  . Smoking status: Never Smoker   . Smokeless tobacco: Never Used  . Alcohol Use: 2.4 oz/week    4 Glasses of wine per week    OB History   Grav Para Term Preterm Abortions TAB SAB Ect Mult Living                  Review of Systems  Constitutional: Negative.   HENT: Positive for congestion, sore throat, rhinorrhea, sneezing, voice change and postnasal drip.   Eyes: Negative.   Respiratory: Positive for cough, chest tightness and shortness of breath.   Cardiovascular: Negative.   Gastrointestinal: Negative.   Endocrine:  Negative.   Musculoskeletal: Negative.   Skin: Negative.   Neurological: Negative.   Hematological: Negative.   Psychiatric/Behavioral: Negative.     Allergies  Review of patient's allergies indicates no known allergies.  Home Medications   Current Outpatient Rx  Name  Route  Sig  Dispense  Refill  . albuterol (PROVENTIL HFA;VENTOLIN HFA) 108 (90 BASE) MCG/ACT inhaler   Inhalation   Inhale 2 puffs into the lungs every 6 (six) hours as needed for wheezing.   1 Inhaler   2   . fluticasone (FLONASE) 50 MCG/ACT nasal spray   Nasal   Place 2 sprays into the nose daily.   16 g   2   . levofloxacin (LEVAQUIN) 500 MG tablet   Oral   Take 1 tablet (500 mg total) by mouth daily.   7 tablet   0   . levothyroxine (SYNTHROID, LEVOTHROID) 137 MCG tablet      1 tablet Daily.         . valACYclovir (VALTREX) 1000 MG tablet      as needed.           BP 119/83  Pulse 80  Temp(Src) 98.2 F (36.8 C) (Oral)  Resp 18  SpO2 99%  Physical Exam  Constitutional: She is oriented to person, place, and time. She appears well-developed and well-nourished.  HENT:  Head: Normocephalic and atraumatic.  Eyes: Conjunctivae are normal. Pupils are equal, round, and reactive to light. Right eye exhibits no discharge. Left eye exhibits no discharge.  Neck: Normal range of motion. Neck supple.  Cardiovascular: Normal rate and regular rhythm.   Pulmonary/Chest: Effort normal and breath sounds normal.  Abdominal: Soft. Bowel sounds are normal.  Lymphadenopathy:    She has no cervical adenopathy.  Neurological: She is alert and oriented to person, place, and time.  Skin: Skin is warm and dry.  Psychiatric: She has a normal mood and affect. Her behavior is normal.    ED Course  Procedures (including critical care time)  Labs Reviewed  POCT RAPID STREP A ([facility] ONLY)   No results found.   1. Allergic rhinitis   2. Bronchitis       MDM  Fluticasone nasal spray, Levaquin  and albuterol inhaler. She is advised that if symptoms recur after this treatment, she should followup with an allergist.        Calvert Cantor, MD 11/04/12 1039

## 2012-11-04 NOTE — ED Notes (Signed)
Pt is here c/o cold sxs onset January... Came here today b/c it's getting worse Sxs include: dry cough, wheezing, rattling chest, SOB, runny nose, swollen glands Denies: f/v/n/d Taking sudafed and zyrtec w/no relief. She is alert and oriented w/no signs of acute distress.

## 2012-11-06 LAB — CULTURE, GROUP A STREP

## 2012-12-08 ENCOUNTER — Emergency Department (HOSPITAL_COMMUNITY)
Admission: EM | Admit: 2012-12-08 | Discharge: 2012-12-08 | Disposition: A | Discharge status: Home / Self Care | Payer: BC Managed Care – PPO | Source: Home / Self Care | Attending: Emergency Medicine | Admitting: Emergency Medicine

## 2012-12-08 ENCOUNTER — Emergency Department (INDEPENDENT_AMBULATORY_CARE_PROVIDER_SITE_OTHER): Payer: BC Managed Care – PPO

## 2012-12-08 ENCOUNTER — Encounter (HOSPITAL_COMMUNITY): Payer: Self-pay | Admitting: Emergency Medicine

## 2012-12-08 DIAGNOSIS — J45901 Unspecified asthma with (acute) exacerbation: Secondary | ICD-10-CM

## 2012-12-08 DIAGNOSIS — J4541 Moderate persistent asthma with (acute) exacerbation: Secondary | ICD-10-CM

## 2012-12-08 MED ORDER — ALBUTEROL SULFATE HFA 108 (90 BASE) MCG/ACT IN AERS: 1.0000 | INHALATION_SPRAY | Freq: Four times a day (QID) | RESPIRATORY_TRACT | Status: DC | PRN

## 2012-12-08 MED ORDER — METHYLPREDNISOLONE SODIUM SUCC 125 MG IJ SOLR
INTRAMUSCULAR | Status: AC
  Filled 2012-12-08: qty 2

## 2012-12-08 MED ORDER — ALBUTEROL SULFATE (5 MG/ML) 0.5% IN NEBU
INHALATION_SOLUTION | RESPIRATORY_TRACT | Status: AC
  Filled 2012-12-08: qty 1

## 2012-12-08 MED ORDER — MONTELUKAST SODIUM 10 MG PO TABS: 10.0000 mg | ORAL_TABLET | Freq: Every day | ORAL | Status: DC

## 2012-12-08 MED ORDER — ALBUTEROL SULFATE (5 MG/ML) 0.5% IN NEBU
5.0000 mg | INHALATION_SOLUTION | Freq: Once | RESPIRATORY_TRACT | Status: AC
  Administered 2012-12-08: 5 mg via RESPIRATORY_TRACT

## 2012-12-08 MED ORDER — BUDESONIDE-FORMOTEROL FUMARATE 160-4.5 MCG/ACT IN AERO: 2.0000 | INHALATION_SPRAY | Freq: Two times a day (BID) | RESPIRATORY_TRACT | Status: DC

## 2012-12-08 MED ORDER — ALBUTEROL SULFATE (2.5 MG/3ML) 0.083% IN NEBU: 2.5000 mg | INHALATION_SOLUTION | RESPIRATORY_TRACT | Status: DC | PRN

## 2012-12-08 MED ORDER — METHYLPREDNISOLONE SODIUM SUCC 125 MG IJ SOLR
125.0000 mg | Freq: Once | INTRAMUSCULAR | Status: AC
  Administered 2012-12-08: 125 mg via INTRAMUSCULAR

## 2012-12-08 MED ORDER — IPRATROPIUM BROMIDE 0.02 % IN SOLN
0.5000 mg | Freq: Once | RESPIRATORY_TRACT | Status: AC
  Administered 2012-12-08: 0.5 mg via RESPIRATORY_TRACT

## 2012-12-08 MED ORDER — PREDNISONE 20 MG PO TABS: ORAL_TABLET | ORAL | Status: DC

## 2012-12-08 NOTE — ED Notes (Signed)
Patient not in treatment room 

## 2012-12-08 NOTE — ED Notes (Signed)
Cough, sob and wheezing.  Patient reports 6 month history of upper respiratory symptoms.  Reports coming to [facility] approx 4 weeks ago.  Treated for bronchitis which included an antibiotic, prednisone and ventolin inhaler.  Reports aboutn a week after finishing antibiotic played tennis and had an asthma attack.  Reports friend wrote for prednisone and prednisone inhaler.  Patient has not felt well at any point, felt somewhat better briefly while taking antibiotic and then when on prednisone.  Patient unable to have root canal secondary to inability to breathe.  Currently on amoxicillin until root canal can be done.  Has an appt with dr Barnetta Chapel at [facility] on august 5.  Has coarse breath sounds, scant wheezing expiratory on left

## 2012-12-08 NOTE — ED Provider Notes (Signed)
Chief Complaint:   Chief Complaint  Patient presents with  . Cough    History of Present Illness:   [name] is a 52 year old female whose symptoms began this past January with allergic rhinitis. She was begun [facility] for that. In March she began to have symptoms of coughing, wheezing, and chest tightness. She was diagnosed as having bronchitis initially. She was given an inhaler with albuterol and Qvar. She has an appointment to see Dr. Sherrie George in another week but she felt that she needed something tonight for her symptoms. She describes chest tightness, shortness of breath, and wheezing. She denies any fever, chills, nasal congestion, or sore throat. She does have episodes of copious rhinorrhea. She denies any pain in the chest or GI symptoms. She has had no prior history of asthma.  Review of Systems:  Other than noted above, the patient denies any of the following symptoms. Systemic:  No fever, chills, sweats, fatigue, myalgias, headache, weight loss or anorexia. ENT:  No earache, ear congestion, nasal congestion, sneezing, rhinorrhea, sinus pressure, sinus pain, post nasal drip, or sore throat. Lungs:  No cough, sputum production, or shortness of breath. No chest pain. Skin:  No rash or itching.  PMFSH:  Past medical history, family history, social history, meds, and allergies were reviewed. No use of tobacco.   Physical Exam:   Vital signs:  BP 126/82  Pulse 59  Temp(Src) 98.3 F (36.8 C) (Oral)  Resp 16  SpO2 100% General:  Alert, in no distress. Eye:  No conjunctival injection or drainage. Lids were normal. ENT:  TMs and canals were normal, without erythema or inflammation.  Nasal mucosa was clear and uncongested, without drainage.  Mucous membranes were moist.  Pharynx was clear, without exudate or drainage.  There were no oral ulcerations or lesions. Neck:  Supple, no adenopathy, tenderness or mass. Lungs:  No retractions or use of accessory muscles.  No respiratory  distress.  There are expiratory wheezes bilaterally both anteriorly and posteriorly with good air movement, no rales or rhonchi. Heart:  Regular rhythm, without gallops, murmers or rubs. Skin:  Clear, warm, and dry, without rash or lesions.  Radiology:  Dg Chest 2 View  12/08/2012   *RADIOLOGY REPORT*  Clinical Data: Chronic cough  CHEST - 2 VIEW  Comparison: None.  Findings: No active infiltrate or effusion is seen.  Mediastinal contours appear normal.  The heart is within normal limits in size. No acute bony abnormality is seen.  A lower anterior cervical spine fusion plate is present.  Bilateral breast implants are noted.  IMPRESSION: No active lung disease.   Original Report Authenticated By: Dwyane Dee, M.D.    Course in Urgent Care Center:   She was given a DuoNeb breathing treatment and felt better immediately thereafter. After the treatment her lungs were clear and wheeze free. She was also given Solu-Medrol 125 mg IM.  Assessment:  The encounter diagnosis was Asthma, moderate persistent, with acute exacerbation.  She has recent onset asthma and will need a full asthma allergy workup. I urged her to keep the appointment with Dr. Gary Fleet.  Plan:   1.  The following meds were prescribed:   Discharge Medication List as of 12/08/2012  5:26 PM    START taking these medications   Details  !! albuterol (PROVENTIL HFA;VENTOLIN HFA) 108 (90 BASE) MCG/ACT inhaler Inhale 1-2 puffs into the lungs every 6 (six) hours as needed for wheezing., Starting 12/08/2012, Until Discontinued, Normal    albuterol (  PROVENTIL) (2.5 MG/3ML) 0.083% nebulizer solution Take 3 mLs (2.5 mg total) by nebulization every 4 (four) hours as needed for wheezing., Starting 12/08/2012, Until Discontinued, Normal    budesonide-formoterol (SYMBICORT) 160-4.5 MCG/ACT inhaler Inhale 2 puffs into the lungs 2 (two) times daily., Starting 12/08/2012, Until Discontinued, Normal    montelukast (SINGULAIR) 10 MG tablet Take 1 tablet (10  mg total) by mouth at bedtime., Starting 12/08/2012, Until Discontinued, Normal    predniSONE (DELTASONE) 20 MG tablet 3 daily for 5 days, 2 daily for 5 days, 1 daily for 5 days, Normal     !! - Potential duplicate medications found. Please discuss with provider.     2.  The patient was instructed in symptomatic care and handouts were given. 3.  The patient was told to return if becoming worse in any way, if no better in 3 or 4 days, and given some red flag symptoms such as worsening respiratory distress that would indicate earlier return. 4.  Follow up with Dr. Sherrie George as soon as possible.     Reuben Likes, MD 12/08/12 2118

## 2013-01-07 ENCOUNTER — Other Ambulatory Visit: Payer: Self-pay | Admitting: Family Medicine

## 2013-01-07 DIAGNOSIS — R519 Headache, unspecified: Secondary | ICD-10-CM

## 2013-01-07 DIAGNOSIS — R0981 Nasal congestion: Secondary | ICD-10-CM

## 2013-01-11 ENCOUNTER — Ambulatory Visit
Admission: RE | Admit: 2013-01-11 | Discharge: 2013-01-11 | Disposition: A | Discharge status: Home / Self Care | Payer: BC Managed Care – PPO | Source: Ambulatory Visit | Attending: Family Medicine | Admitting: Family Medicine

## 2013-01-11 DIAGNOSIS — R519 Headache, unspecified: Secondary | ICD-10-CM

## 2013-01-11 DIAGNOSIS — R0981 Nasal congestion: Secondary | ICD-10-CM

## 2013-03-11 ENCOUNTER — Ambulatory Visit (INDEPENDENT_AMBULATORY_CARE_PROVIDER_SITE_OTHER): Payer: BC Managed Care – PPO | Admitting: Podiatry

## 2013-03-11 ENCOUNTER — Encounter: Payer: Self-pay | Admitting: Podiatry

## 2013-03-11 VITALS — BP 144/98 | HR 79 | Resp 12 | Ht 67.0 in | Wt 138.0 lb

## 2013-03-11 DIAGNOSIS — M779 Enthesopathy, unspecified: Secondary | ICD-10-CM

## 2013-03-11 DIAGNOSIS — M216X9 Other acquired deformities of unspecified foot: Secondary | ICD-10-CM

## 2013-03-11 NOTE — Progress Notes (Signed)
Subjective:     Patient ID: [name], female   DOB: 04-29-1961, 52 y.o.   MRN: [id number]  Foot Pain   patient states my foot is still very sore and the injection did not help. Also complains of pain on the outside of the foot stating that the bone is bothering her   Review of Systems  All other systems reviewed and are negative.       Objective:   Physical Exam  Nursing note and vitals reviewed. Cardiovascular: Intact distal pulses.   Musculoskeletal: Normal range of motion.  Neurological: She is alert.  Skin: Skin is warm.   patient has pain with a plantarflexed third metatarsal noted and also pain on the outside of the fifth metatarsal. 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

## 2013-03-11 NOTE — Patient Instructions (Signed)

## 2013-04-01 ENCOUNTER — Other Ambulatory Visit: Payer: Self-pay | Admitting: Otolaryngology

## 2013-04-08 ENCOUNTER — Encounter: Payer: Self-pay | Admitting: Podiatry

## 2013-04-08 ENCOUNTER — Ambulatory Visit (INDEPENDENT_AMBULATORY_CARE_PROVIDER_SITE_OTHER): Payer: BC Managed Care – PPO | Admitting: Podiatry

## 2013-04-08 VITALS — BP 116/80 | HR 77 | Resp 16 | Ht 68.0 in | Wt 140.0 lb

## 2013-04-08 DIAGNOSIS — M7750 Other enthesopathy of unspecified foot: Secondary | ICD-10-CM

## 2013-04-09 NOTE — Progress Notes (Signed)
Subjective:     Patient ID: [name], female   DOB: 1960/08/24, 52 y.o.   MRN: [id number]  HPI patient presents stating the right orthotic does not feel good to me I am still having pain in my foot   Review of Systems     Objective:   Physical Exam  Nursing note and vitals reviewed. Constitutional: She is oriented to person, place, and time.  Cardiovascular: Intact distal pulses.   Musculoskeletal: Normal range of motion.  Neurological: She is oriented to person, place, and time.  Skin: Skin is warm.   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 Spent time with her trying to figure out changes that we will make and we are going to have it modified at this time

## 2013-05-07 ENCOUNTER — Encounter: Payer: Self-pay | Admitting: Podiatry

## 2013-05-25 ENCOUNTER — Ambulatory Visit
Admission: RE | Admit: 2013-05-25 | Discharge: 2013-05-25 | Disposition: A | Discharge status: Home / Self Care | Payer: BC Managed Care – PPO | Source: Ambulatory Visit | Attending: Allergy and Immunology | Admitting: Allergy and Immunology

## 2013-05-25 ENCOUNTER — Other Ambulatory Visit: Payer: Self-pay | Admitting: Allergy and Immunology

## 2013-05-25 DIAGNOSIS — J309 Allergic rhinitis, unspecified: Secondary | ICD-10-CM

## 2013-05-25 DIAGNOSIS — J45909 Unspecified asthma, uncomplicated: Secondary | ICD-10-CM

## 2013-05-25 DIAGNOSIS — J45901 Unspecified asthma with (acute) exacerbation: Secondary | ICD-10-CM

## 2013-06-18 ENCOUNTER — Encounter: Payer: Self-pay | Admitting: Critical Care Medicine

## 2013-06-18 ENCOUNTER — Ambulatory Visit (INDEPENDENT_AMBULATORY_CARE_PROVIDER_SITE_OTHER): Payer: BC Managed Care – PPO | Admitting: Critical Care Medicine

## 2013-06-18 VITALS — BP 134/94 | HR 76 | Temp 98.4°F | Ht 67.5 in | Wt 143.0 lb

## 2013-06-18 DIAGNOSIS — J309 Allergic rhinitis, unspecified: Secondary | ICD-10-CM

## 2013-06-18 DIAGNOSIS — R911 Solitary pulmonary nodule: Secondary | ICD-10-CM

## 2013-06-18 DIAGNOSIS — K219 Gastro-esophageal reflux disease without esophagitis: Secondary | ICD-10-CM | POA: Insufficient documentation

## 2013-06-18 DIAGNOSIS — R059 Cough, unspecified: Secondary | ICD-10-CM

## 2013-06-18 DIAGNOSIS — J329 Chronic sinusitis, unspecified: Secondary | ICD-10-CM

## 2013-06-18 DIAGNOSIS — E079 Disorder of thyroid, unspecified: Secondary | ICD-10-CM | POA: Insufficient documentation

## 2013-06-18 DIAGNOSIS — R05 Cough: Secondary | ICD-10-CM | POA: Insufficient documentation

## 2013-06-18 DIAGNOSIS — M1990 Unspecified osteoarthritis, unspecified site: Secondary | ICD-10-CM | POA: Insufficient documentation

## 2013-06-18 MED ORDER — FAMOTIDINE 20 MG PO TABS: 40.0000 mg | ORAL_TABLET | Freq: Every day | ORAL | Status: DC

## 2013-06-18 MED ORDER — BENZONATATE 100 MG PO CAPS: ORAL_CAPSULE | ORAL | Status: DC

## 2013-06-18 MED ORDER — HYDROCOD POLST-CPM POLST ER 10-8 MG PO CP12: ORAL_CAPSULE | ORAL | Status: DC

## 2013-06-18 MED ORDER — OMEPRAZOLE-SODIUM BICARBONATE 40-1100 MG PO CAPS: 1.0000 | ORAL_CAPSULE | Freq: Every day | ORAL | Status: DC

## 2013-06-18 NOTE — Progress Notes (Signed)
Subjective:    Patient ID: [name], female    DOB: [birth year], 53 y.o.   MRN: [id number]  HPI Comments: Cough for 1 yr.  occ bronchitis  ? Sinus ? Asthma  ? PNA  cxr RUL nodule No fever.  Now is dry  Cough This is a chronic problem. The current episode started more than 1 year ago. The problem has been waxing and waning. The cough is non-productive. Associated symptoms include chest pain, shortness of breath and wheezing. Pertinent negatives include no chills, ear congestion, ear pain, fever, headaches, heartburn, hemoptysis, myalgias, nasal congestion, postnasal drip, rash, rhinorrhea, sore throat, sweats or weight loss. Associated symptoms comments: Sinus surgery 03/2013>>infected, surgery, no improvement Wheezing worse at night lay flat, "breath  Out of straw ". The symptoms are aggravated by lying down. She has tried a beta-agonist inhaler and steroid inhaler (pred 5 times and mult rounds of ABX and no releife) for the symptoms. The treatment provided no relief. Her past medical history is significant for asthma and environmental allergies. There is no history of bronchiectasis, COPD, emphysema or pneumonia.  Note has seen Allergy on several occasions. Rx has included: Flovent, symbicort, Dulera, azithromycin, albuterol, Note no improvement in wheezing with sinus surgery, not regularly using PPI, Rx also singulair, antihistamines, sinus rinses, patanase, pred pulses. Depo medrols.   Past Medical History  Diagnosis Date  . Bunion   . Hammer toe   . Arthritis   . Thyroid disease     hypothyroid  . Asthma     DX made  and treated per allergy MD  . GERD (gastroesophageal reflux disease)      Family History  Problem Relation Age of Onset  . Colon cancer Neg Hx   . Esophageal cancer Neg Hx   . Stomach cancer Neg Hx   . Rectal cancer Neg Hx   . Emphysema Mother      History   Social History  . Marital Status: Legally Separated    Spouse Name: N/A    Number of Children:  N/A  . Years of Education: N/A   Occupational History  . Not on file.   Social History Main Topics  . Smoking status: Never Smoker   . Smokeless tobacco: Never Used  . Alcohol Use: 2.4 oz/week    4 Glasses of wine per week     Comment: 4 days per wk  . Drug Use: No  . Sexual Activity: Not on file   Other Topics Concern  . Not on file   Social History Narrative  . No narrative on file     No Known Allergies   Outpatient Prescriptions Prior to Visit  Medication Sig Dispense Refill  . albuterol (PROVENTIL HFA;VENTOLIN HFA) 108 (90 BASE) MCG/ACT inhaler Inhale 2 puffs into the lungs every 6 (six) hours as needed for wheezing.  1 Inhaler  2  . albuterol (PROVENTIL) (2.5 MG/3ML) 0.083% nebulizer solution Take 3 mLs (2.5 mg total) by nebulization every 4 (four) hours as needed for wheezing.  75 mL  12  . fluticasone (FLONASE) 50 MCG/ACT nasal spray Place 2 sprays into the nose daily.  16 g  2  . levothyroxine (SYNTHROID, LEVOTHROID) 137 MCG tablet 1 tablet Daily.      . valACYclovir (VALTREX) 1000 MG tablet as needed.      Marland Kitchen albuterol (PROVENTIL HFA;VENTOLIN HFA) 108 (90 BASE) MCG/ACT inhaler Inhale 1-2 puffs into the lungs every 6 (six) hours as needed for wheezing.  1  Inhaler  0  . budesonide-formoterol (SYMBICORT) 160-4.5 MCG/ACT inhaler Inhale 2 puffs into the lungs 2 (two) times daily.  1 Inhaler  12  . montelukast (SINGULAIR) 10 MG tablet Take 1 tablet (10 mg total) by mouth at bedtime.  30 tablet  0  . AMOXICILLIN PO Take by mouth.      . levofloxacin (LEVAQUIN) 500 MG tablet Take 1 tablet (500 mg total) by mouth daily.  7 tablet  0  . OVER THE COUNTER MEDICATION Prednisone inhaler      . predniSONE (DELTASONE) 20 MG tablet 3 daily for 5 days, 2 daily for 5 days, 1 daily for 5 days  30 tablet  0   No facility-administered medications prior to visit.      Review of Systems  Constitutional: Positive for activity change and fatigue. Negative for fever, chills, weight loss,  diaphoresis, appetite change and unexpected weight change.  HENT: Positive for congestion and tinnitus. Negative for dental problem, ear discharge, ear pain, facial swelling, hearing loss, mouth sores, nosebleeds, postnasal drip, rhinorrhea, sinus pressure, sneezing, sore throat, trouble swallowing and voice change.   Eyes: Negative for photophobia, discharge, itching and visual disturbance.  Respiratory: Positive for cough, chest tightness, shortness of breath and wheezing. Negative for apnea, hemoptysis, choking and stridor.   Cardiovascular: Positive for chest pain. Negative for palpitations and leg swelling.  Gastrointestinal: Negative for heartburn, nausea, vomiting, abdominal pain, constipation, blood in stool and abdominal distention.  Genitourinary: Negative for dysuria, urgency, frequency, hematuria, flank pain, decreased urine volume and difficulty urinating.  Musculoskeletal: Negative for arthralgias, back pain, gait problem, joint swelling, myalgias, neck pain and neck stiffness.  Skin: Negative for color change, pallor and rash.  Allergic/Immunologic: Positive for environmental allergies.  Neurological: Positive for dizziness. Negative for tremors, seizures, syncope, speech difficulty, weakness, light-headedness, numbness and headaches.  Hematological: Negative for adenopathy. Does not bruise/bleed easily.  Psychiatric/Behavioral: Positive for sleep disturbance. Negative for confusion and agitation. The patient is not nervous/anxious.        Objective:   Physical Exam Filed Vitals:   06/18/13 1443  BP: 134/94  Pulse: 76  Temp: 98.4 F (36.9 C)  TempSrc: Oral  Height: 5' 7.5" (1.715 m)  Weight: 143 lb (64.864 kg)  SpO2: 94%    Gen: Pleasant, well-nourished, in no distress,  normal affect  ENT: No lesions,  mouth clear,  oropharynx clear,  ++ postnasal drip, mild rhinits  Neck: No JVD, no TMG, no carotid bruits  Lungs: No use of accessory muscles, no dullness to  percussion, prom pseudowheeze in expiratory phase, goes away with purselip breathing  Cardiovascular: RRR, heart sounds normal, no murmur or gallops, no peripheral edema  Abdomen: soft and NT, no HSM,  BS normal  Musculoskeletal: No deformities, no cyanosis or clubbing  Neuro: alert, non focal  Skin: Warm, no lesions or rashes  No results found. Arlyce Harman: F/V loop c/w VCD, normal spiro  FeV1 80% FVC 75% FeV1/FVC 84% CXR: RUL nodule.     Assessment & Plan:   Cough Cyclical cough, I doubt true lower airway obstruction or asthma/RAD.  Upper airway instability, vocal cord dysfunction, reproduceable pseudowheeze.  Postnasal drip syndrome. Hx of sinusits s/p nasal septoplasty.  Now no evident sinusitis. GERD also playing a significant role. Laba/ICS now only exacerbating upper airway dysfunction.  Plan Start Cough protocol : tussicaps/ benzonatate Stop symbicort Stay flonase Start zegerid  Start pepcid Stop prilosec Reflux diet May need FOB Need records from ENT [facility] Needs CT Chest (  see nodule assessment)  Sinusitis, chronic Hx of recurrent bronchitis and sinusitis s/p nasal septoplasty/sinus surgery No evident active sinus dz  Allergic rhinitis Chronic allergic rhinitis and post nasal drip  Solitary pulmonary nodule RUL lung nodule seen on plain film, likely unrelated to cough in a never smoker Plan CT chest     Updated Medication List Outpatient Encounter Prescriptions as of 06/18/2013  Medication Sig  . albuterol (PROVENTIL HFA;VENTOLIN HFA) 108 (90 BASE) MCG/ACT inhaler Inhale 2 puffs into the lungs every 6 (six) hours as needed for wheezing.  Marland Kitchen albuterol (PROVENTIL) (2.5 MG/3ML) 0.083% nebulizer solution Take 3 mLs (2.5 mg total) by nebulization every 4 (four) hours as needed for wheezing.  . fluticasone (FLONASE) 50 MCG/ACT nasal spray Place 2 sprays into the nose daily.  Marland Kitchen levothyroxine (SYNTHROID, LEVOTHROID) 137 MCG tablet 1 tablet Daily.  . montelukast  (SINGULAIR) 10 MG tablet Take 10 mg by mouth at bedtime.  . nitrofurantoin, macrocrystal-monohydrate, (MACROBID) 100 MG capsule as needed.  . valACYclovir (VALTREX) 1000 MG tablet as needed.  . [DISCONTINUED] albuterol (PROVENTIL HFA;VENTOLIN HFA) 108 (90 BASE) MCG/ACT inhaler Inhale 1-2 puffs into the lungs every 6 (six) hours as needed for wheezing.  . [DISCONTINUED] budesonide-formoterol (SYMBICORT) 160-4.5 MCG/ACT inhaler Inhale 2 puffs into the lungs 2 (two) times daily.  . [DISCONTINUED] montelukast (SINGULAIR) 10 MG tablet Take 1 tablet (10 mg total) by mouth at bedtime.  . [DISCONTINUED] omeprazole (PRILOSEC) 40 MG capsule as needed.  . benzonatate (TESSALON) 100 MG capsule 1-2 every 4 hours as needed for cough  . famotidine (PEPCID) 20 MG tablet Take 2 tablets (40 mg total) by mouth at bedtime.  Marland Kitchen Hydrocod Polst-Chlorphen Polst (TUSSICAPS) 10-8 MG CP12 One twice daily as needed per cough protocol  . omeprazole-sodium bicarbonate (ZEGERID) 40-1100 MG per capsule Take 1 capsule by mouth daily before breakfast.  . [DISCONTINUED] AMOXICILLIN PO Take by mouth.  . [DISCONTINUED] levofloxacin (LEVAQUIN) 500 MG tablet Take 1 tablet (500 mg total) by mouth daily.  . [DISCONTINUED] OVER THE COUNTER MEDICATION Prednisone inhaler  . [DISCONTINUED] predniSONE (DELTASONE) 20 MG tablet 3 daily for 5 days, 2 daily for 5 days, 1 daily for 5 days

## 2013-06-18 NOTE — Patient Instructions (Addendum)
Start Cough protocol : tussicaps/ benzonatate Stop symbicort Stay flonase Start zegerid  Start pepcid Stop prilosec Reflux diet A CT Chest will be obtained We may need to do a bronchoscopy Record release from Dr Jerrell Belfast Return 1 month

## 2013-06-20 ENCOUNTER — Encounter: Payer: Self-pay | Admitting: Critical Care Medicine

## 2013-06-20 DIAGNOSIS — J309 Allergic rhinitis, unspecified: Secondary | ICD-10-CM | POA: Insufficient documentation

## 2013-06-20 DIAGNOSIS — J329 Chronic sinusitis, unspecified: Secondary | ICD-10-CM | POA: Insufficient documentation

## 2013-06-20 DIAGNOSIS — R911 Solitary pulmonary nodule: Secondary | ICD-10-CM | POA: Insufficient documentation

## 2013-06-20 NOTE — Assessment & Plan Note (Signed)
Cyclical cough, I doubt true lower airway obstruction or asthma/RAD.  Upper airway instability, vocal cord dysfunction, reproduceable pseudowheeze.  Postnasal drip syndrome. Hx of sinusits s/p nasal septoplasty.  Now no evident sinusitis. GERD also playing a significant role. Laba/ICS now only exacerbating upper airway dysfunction.  Plan Start Cough protocol : tussicaps/ benzonatate Stop symbicort Stay flonase Start zegerid  Start pepcid Stop prilosec Reflux diet May need FOB Need records from ENT [facility] Needs CT Chest (see nodule assessment)

## 2013-06-20 NOTE — Assessment & Plan Note (Signed)
Chronic allergic rhinitis and post nasal drip

## 2013-06-20 NOTE — Assessment & Plan Note (Signed)
RUL lung nodule seen on plain film, likely unrelated to cough in a never smoker Plan CT chest

## 2013-06-20 NOTE — Assessment & Plan Note (Signed)
Hx of recurrent bronchitis and sinusitis s/p nasal septoplasty/sinus surgery No evident active sinus dz

## 2013-06-24 ENCOUNTER — Ambulatory Visit (INDEPENDENT_AMBULATORY_CARE_PROVIDER_SITE_OTHER)
Admission: RE | Admit: 2013-06-24 | Discharge: 2013-06-24 | Disposition: A | Discharge status: Home / Self Care | Payer: BC Managed Care – PPO | Source: Ambulatory Visit | Attending: Critical Care Medicine | Admitting: Critical Care Medicine

## 2013-06-24 ENCOUNTER — Institutional Professional Consult (permissible substitution): Payer: BC Managed Care – PPO | Admitting: Internal Medicine

## 2013-06-24 DIAGNOSIS — R911 Solitary pulmonary nodule: Secondary | ICD-10-CM

## 2013-06-28 ENCOUNTER — Telehealth: Payer: Self-pay | Admitting: Critical Care Medicine

## 2013-06-28 DIAGNOSIS — K219 Gastro-esophageal reflux disease without esophagitis: Secondary | ICD-10-CM

## 2013-06-28 NOTE — Telephone Encounter (Signed)
I spoke with the pt and she states she went to the ENT yesterday and he put a scope in her throat and looked at her vocal cords. He advised her vocal cords are fine. He did mention that she had some irritation on the back of her "vocie box" and that this was most likely caused by reflux. The pt is concerned about the reflux and is asking for a referral to GI. She is also concerned she ma have a hiatal hernia. Please advise. Bing, CMA

## 2013-06-29 NOTE — Telephone Encounter (Signed)
i am fine with referral to GI Dr Silvano Rusk for GERD/Hiatal hernia

## 2013-06-29 NOTE — Telephone Encounter (Signed)
LMTC x 1  

## 2013-06-29 NOTE — Telephone Encounter (Signed)
Spoke with pt and advised that Dr Joya Gaskins is ok with GI referral.  Pt's neighbor is Dr Scarlette Shorts and would like to see him instead.  Pt has spoken with Dr Henrene Pastor and he is going to work her in his schedule.  Referral will be placed to have it in EPIC system.

## 2013-07-01 ENCOUNTER — Ambulatory Visit (INDEPENDENT_AMBULATORY_CARE_PROVIDER_SITE_OTHER): Payer: BC Managed Care – PPO | Admitting: Internal Medicine

## 2013-07-01 ENCOUNTER — Encounter: Payer: Self-pay | Admitting: Internal Medicine

## 2013-07-01 VITALS — BP 114/80 | HR 96 | Ht 66.5 in | Wt 142.4 lb

## 2013-07-01 DIAGNOSIS — R059 Cough, unspecified: Secondary | ICD-10-CM

## 2013-07-01 DIAGNOSIS — R05 Cough: Secondary | ICD-10-CM

## 2013-07-01 DIAGNOSIS — K219 Gastro-esophageal reflux disease without esophagitis: Secondary | ICD-10-CM

## 2013-07-01 DIAGNOSIS — R062 Wheezing: Secondary | ICD-10-CM

## 2013-07-01 MED ORDER — OMEPRAZOLE-SODIUM BICARBONATE 40-1100 MG PO CAPS: 1.0000 | ORAL_CAPSULE | Freq: Two times a day (BID) | ORAL | Status: DC

## 2013-07-01 NOTE — Patient Instructions (Addendum)
You have been scheduled for an endoscopy with propofol. Please follow written instructions given to you at your visit today. If you use inhalers (even only as needed), please bring them with you on the day of your procedure.   Increase your Zegerid to twice a day.  I sent a new prescription to your pharmacy reflecting this change

## 2013-07-01 NOTE — Progress Notes (Signed)
HISTORY OF PRESENT ILLNESS:  [name] is a 53 y.o. female with the below listed medical history who presents today regarding one-year history of respiratory symptoms and possible GERD. Patient states that she was evaluated by ENT approximately 10 years ago for chronic sore throat. She was diagnosed with "GERD". She was prescribed Nexium but did not take. She was in her usual state of health until January 2014 when she developed upper respiratory complaints. Diagnosed with bronchitis and treated. Subsequently developed problems with cough, shortness of breath, and wheezing. Problems progressed and one point were quite severe. She was evaluated in the emergency room. Given different therapies including antibiotics, bronchodilators, and steroids. Steroid seemed to help the most. Symptoms have never resolved. She was evaluated by ENT and diagnosed with sinusitis for which she underwent sinus surgery (septoplasty). After recovering from surgery, symptoms have persisted. Underwent what sounds like laryngoscopy and was diagnosed with "GERD". Seen 2 weeks ago by Dr. Joya Gaskins in pulmonary. Felt to have cyclic cough. Multiple other respiratory diagnoses. GERD felt to be a significant contributor. Underwent pulmonary function studies CT of the chest okay. Given GERD diet and prescribed Zegerid and famotidine. She has been compliant. No change in symptoms. Wheezing noises at night interfere with sleep. Breathing problems during the day interfere with activities. The patient denies any classic reflux symptoms such as pyrosis, regurgitation, water brash, or dysphagia. GI review of systems remarkable for decreased appetite and some weight loss over the past 10 days. Otherwise negative. Prior colonoscopy with Dr. Olevia Perches.  REVIEW OF SYSTEMS:  All non-GI ROS negative except for arthritis, cough, fatigue, night sweats, shortness of breath, sleeping problems, sore throat  Past Medical History  Diagnosis Date  . Bunion    . Hammer toe   . Arthritis   . Hypothyroidism   . Asthma     DX made  and treated per allergy MD  . GERD (gastroesophageal reflux disease)     Past Surgical History  Procedure Laterality Date  . Colonoscopy    . Polypectomy    . Cesarean section      x2  . Total knee arthroplasty Right   . Neck surgery      plates and screws in  . Rotator cuff repair Right     right  . Bunionectomy with hammertoe reconstruction Bilateral   . Nasal sinus surgery    . Great toe arthrodesis, interphalangeal joint      Social History [name]  reports that she has never smoked. She has never used smokeless tobacco. She reports that she drinks about 2.4 ounces of alcohol per week. She reports that she does not use illicit drugs.  family history includes Emphysema in her mother. There is no history of Colon cancer, Esophageal cancer, Stomach cancer, or Rectal cancer.  No Known Allergies     PHYSICAL EXAMINATION: Vital signs: BP 114/80  Pulse 96  Ht 5' 6.5" (1.689 m)  Wt 142 lb 6 oz (64.581 kg)  BMI 22.64 kg/m2 General: Well-developed, well-nourished, no acute distress HEENT: Sclerae are anicteric, conjunctiva pink. Oral mucosa intact Lungs: Clear with end expiratory wheeze on the left Heart: Regular Abdomen: soft, nontender, nondistended, no obvious ascites, no peritoneal signs, normal bowel sounds. No organomegaly. No hernia Extremities: No edema Psychiatric: alert and oriented x3. Cooperative   ASSESSMENT:  #1. Chronic respiratory complaints including cough, shortness of breath, and wheezing. No response to multiple therapies or sinus surgery. GERD purported to be the cause, though this is  uncertain. Reasonable to treat aggressively for GERD and see what happens. We can always do a pH study to definitively and accurately diagnose or exclude GERD. I discussed with her the likelihood that her current symptoms are related to GERD and will respond to therapy.   PLAN:  #1. Reflux  precautions #2. Increase Zegerid 40 mg twice a day and continue famotidine at night. Prescription rewritten. Would treat for a minimum of 2-3 months before deciding on effectiveness #3. Diagnostic upper endoscopy to assess for objective evidence of GERD.The nature of the procedure, as well as the risks, benefits, and alternatives were carefully and thoroughly reviewed with the patient. Ample time for discussion and questions allowed. The patient understood, was satisfied, and agreed to proceed. #4. Continue other pulmonary recommendations and keep pulmonary followup as planned

## 2013-07-05 ENCOUNTER — Encounter: Payer: Self-pay | Admitting: Internal Medicine

## 2013-07-08 ENCOUNTER — Ambulatory Visit (AMBULATORY_SURGERY_CENTER): Payer: BC Managed Care – PPO | Admitting: Internal Medicine

## 2013-07-08 ENCOUNTER — Telehealth: Payer: Self-pay | Admitting: Critical Care Medicine

## 2013-07-08 ENCOUNTER — Encounter: Payer: Self-pay | Admitting: Internal Medicine

## 2013-07-08 VITALS — BP 126/85 | HR 58 | Temp 98.2°F | Resp 19 | Ht 66.0 in | Wt 142.0 lb

## 2013-07-08 DIAGNOSIS — K219 Gastro-esophageal reflux disease without esophagitis: Secondary | ICD-10-CM

## 2013-07-08 DIAGNOSIS — D130 Benign neoplasm of esophagus: Secondary | ICD-10-CM

## 2013-07-08 MED ORDER — SODIUM CHLORIDE 0.9 % IV SOLN: 500.0000 mL | INTRAVENOUS | Status: DC

## 2013-07-08 NOTE — Patient Instructions (Signed)
Impressions/recommendations:  Normal EGD  Continue Zegerid twice daily Keep followup with your pulmonologist/allergist GI office follow up in 8 weeks.  YOU HAD AN ENDOSCOPIC PROCEDURE TODAY AT [facility]: Refer to the procedure report that was given to you for any specific questions about what was found during the examination.  If the procedure report does not answer your questions, please call your gastroenterologist to clarify.  If you requested that your care partner not be given the details of your procedure findings, then the procedure report has been included in a sealed envelope for you to review at your convenience later.  YOU SHOULD EXPECT: Some feelings of bloating in the abdomen. Passage of more gas than usual.  Walking can help get rid of the air that was put into your GI tract during the procedure and reduce the bloating. If you had a lower endoscopy (such as a colonoscopy or flexible sigmoidoscopy) you may notice spotting of blood in your stool or on the toilet paper. If you underwent a bowel prep for your procedure, then you may not have a normal bowel movement for a few days.  DIET: Your first meal following the procedure should be a light meal and then it is ok to progress to your normal diet.  A half-sandwich or bowl of soup is an example of a good first meal.  Heavy or fried foods are harder to digest and may make you feel nauseous or bloated.  Likewise meals heavy in dairy and vegetables can cause extra gas to form and this can also increase the bloating.  Drink plenty of fluids but you should avoid alcoholic beverages for 24 hours.  ACTIVITY: Your care partner should take you home directly after the procedure.  You should plan to take it easy, moving slowly for the rest of the day.  You can resume normal activity the day after the procedure however you should NOT DRIVE or use heavy machinery for 24 hours (because of the sedation medicines used during the test).     SYMPTOMS TO REPORT IMMEDIATELY: A gastroenterologist can be reached at any hour.  During normal business hours, 8:30 AM to 5:00 PM Monday through Friday, call [phone].  After hours and on weekends, please call the GI answering service at [phone] who will take a message and have the physician on call contact you.   Following upper endoscopy (EGD)  Vomiting of blood or coffee ground material  New chest pain or pain under the shoulder blades  Painful or persistently difficult swallowing  New shortness of breath  Fever of 100F or higher  Black, tarry-looking stools  FOLLOW UP: If any biopsies were taken you will be contacted by phone or by letter within the next 1-3 weeks.  Call your gastroenterologist if you have not heard about the biopsies in 3 weeks.  Our staff will call the home number listed on your records the next business day following your procedure to check on you and address any questions or concerns that you may have at that time regarding the information given to you following your procedure. This is a courtesy call and so if there is no answer at the home number and we have not heard from you through the emergency physician on call, we will assume that you have returned to your regular daily activities without incident.  SIGNATURES/CONFIDENTIALITY: You and/or your care partner have signed paperwork which will be entered into your electronic medical record.  These signatures attest  to the fact that that the information above on your After Visit Summary has been reviewed and is understood.  Full responsibility of the confidentiality of this discharge information lies with you and/or your care-partner. 

## 2013-07-08 NOTE — Progress Notes (Signed)
Lidocaine-40mg IV prior to Propofol InductionPropofol given over incremental dosages 

## 2013-07-08 NOTE — Progress Notes (Signed)
Called to room to assist during endoscopic procedure.  Patient ID and intended procedure confirmed with present staff. Received instructions for my participation in the procedure from the performing physician.  

## 2013-07-08 NOTE — Op Note (Signed)
[facility]  [address] [place], [address]   ENDOSCOPY PROCEDURE REPORT  PATIENT: [name], [name]  MR#: [id number] BIRTHDATE: [birth year] , 2  yrs. old GENDER: Female ENDOSCOPIST: Eustace Quail, MD REFERRED BY:  Elsie Stain, M.D. PROCEDURE DATE:  07/08/2013 PROCEDURE:  EGD w/ biopsy ASA CLASS:     Class II INDICATIONS:  History of chronich cough and breathing difficulty- ???? esophageal reflux. MEDICATIONS: MAC sedation, administered by CRNA and propofol (Diprivan) 150mg  IV TOPICAL ANESTHETIC: Cetacaine Spray  DESCRIPTION OF PROCEDURE: After the risks benefits and alternatives of the procedure were thoroughly explained, informed consent was obtained.  The LB [id number] endoscope was introduced through the mouth and advanced to the second portion of the duodenum. Without limitations.  The instrument was slowly withdrawn as the mucosa was fully examined.    EXAM: The upper, middle and distal third of the esophagus were carefully inspected and no abnormalities were noted.  The z-line was well seen at the GEJ.  The endoscope was pushed into the fundus which was normal including a retroflexed view.  The antrum, gastric body, first and second part of the duodenum were unremarkable. Retroflexed views revealed no abnormalities. Esophageal bx taken. The scope was then withdrawn from the patient and the procedure completed.  COMPLICATIONS: There were no complications. ENDOSCOPIC IMPRESSION: 1. Normal EGD 2. Chronic cough, congestion, difficulty breathing. Etiology undetermined  RECOMMENDATIONS: 1. Continue PPI (Zegerid twice daily) 2. Follow up biopsies 3. Keep follow up with your pulmonologist / allergist for ongoing breathing difficulties 4. GI office follow up in 8 weeks  REPEAT EXAM:  eSigned:  Eustace Quail, MD 07/08/2013 8:25 AM   ST:[id number] Dema Severin, MD, Elsie Stain, MD, and The Patient

## 2013-07-08 NOTE — Telephone Encounter (Signed)
Spoke with the pt  She states had endo with Dr Henrene Pastor this am which was normal  Her cough is no better since last visit here, and Dr Henrene Pastor rec that she call here to get pred called in  She denies that the cough is any worse and has no new co's  She states that pred has helped in the past, and that Dr Henrene Pastor rec that she take 3 wks worth of med, but wants PW to prescribe  Please advise,thanks!

## 2013-07-08 NOTE — Telephone Encounter (Signed)
i am ok with :  Prednisone 10mg  Take 4 for three days 3 for three days 2 for three days 1 for three days and stop #30

## 2013-07-09 ENCOUNTER — Telehealth: Payer: Self-pay | Admitting: *Deleted

## 2013-07-09 MED ORDER — PREDNISONE 10 MG PO TABS: ORAL_TABLET | ORAL | Status: DC

## 2013-07-09 NOTE — Telephone Encounter (Signed)
  Follow up Call-  Call back number 07/08/2013 08/28/2011  Post procedure Call Back phone  # [phone]  Permission to leave phone message Yes Yes     Patient questions:  Do you have a fever, pain , or abdominal swelling? no Pain Score  0 *  Have you tolerated food without any problems? yes  Have you been able to return to your normal activities? yes  Do you have any questions about your discharge instructions: Diet   no Medications  no Follow up visit  no  Do you have questions or concerns about your Care? no  Actions: * If pain score is 4 or above: No action needed, pain <4.

## 2013-07-09 NOTE — Telephone Encounter (Signed)
Called, spoke with pt.  Informed her of below per PW.  She verbalized understanding and is in agreement with this plan. Pt believes this will help her and will call back if symptoms do not improve or worsen.  Pred Taper sent to [facility] - pt aware and voiced no further questions or concerns at this time.

## 2013-07-14 ENCOUNTER — Encounter: Payer: Self-pay | Admitting: Internal Medicine

## 2013-07-26 ENCOUNTER — Ambulatory Visit: Payer: BC Managed Care – PPO | Admitting: Critical Care Medicine

## 2013-08-06 ENCOUNTER — Ambulatory Visit (INDEPENDENT_AMBULATORY_CARE_PROVIDER_SITE_OTHER): Payer: BC Managed Care – PPO | Admitting: Critical Care Medicine

## 2013-08-06 ENCOUNTER — Ambulatory Visit: Payer: BC Managed Care – PPO | Admitting: Critical Care Medicine

## 2013-08-06 ENCOUNTER — Encounter: Payer: Self-pay | Admitting: Critical Care Medicine

## 2013-08-06 VITALS — BP 124/80 | HR 79 | Temp 97.8°F | Ht 67.0 in | Wt 147.4 lb

## 2013-08-06 DIAGNOSIS — R059 Cough, unspecified: Secondary | ICD-10-CM

## 2013-08-06 DIAGNOSIS — R05 Cough: Secondary | ICD-10-CM

## 2013-08-06 DIAGNOSIS — J329 Chronic sinusitis, unspecified: Secondary | ICD-10-CM

## 2013-08-06 MED ORDER — MELOXICAM 15 MG PO TABS: 15.0000 mg | ORAL_TABLET | Freq: Every day | ORAL | Status: DC

## 2013-08-06 MED ORDER — MOXIFLOXACIN HCL 400 MG PO TABS: 400.0000 mg | ORAL_TABLET | Freq: Every day | ORAL | Status: DC

## 2013-08-06 NOTE — Assessment & Plan Note (Signed)
Chronic sinusitis See cough eval

## 2013-08-06 NOTE — Progress Notes (Signed)
Subjective:    Patient ID: [name], female    DOB: 1960-07-21, 53 y.o.   MRN: [id number]  HPI 08/06/2013 Chief Complaint  Patient presents with  . 1 month follow up    Cough has improved - prod now with green mucus.  C/o constant dull pain under left breast x 3 wks; pain is worse and changes to stabing pain with cough or when inhaling deeply.  Cough is better.  Not as much wheezing.  Pt had strep throat, EGD was normal, esophagus neg for GERD.   Three weeks ago severe pain L chest area.  Dull ache if deep breath hurts.   Pt still using symbicort prn.   No real post nasal drip.  rx more abx for strep throat.  Past Medical History  Diagnosis Date  . Bunion   . Hammer toe   . Arthritis   . Hypothyroidism   . Asthma     DX made  and treated per allergy MD  . GERD (gastroesophageal reflux disease)      Family History  Problem Relation Age of Onset  . Colon cancer Neg Hx   . Esophageal cancer Neg Hx   . Stomach cancer Neg Hx   . Rectal cancer Neg Hx   . Emphysema Mother   . Melanoma Father   . Heart disease Maternal Uncle   . Melanoma Paternal Uncle      History   Social History  . Marital Status: Legally Separated    Spouse Name: N/A    Number of Children: 2  . Years of Education: N/A   Occupational History  . Not on file.   Social History Main Topics  . Smoking status: Never Smoker   . Smokeless tobacco: Never Used  . Alcohol Use: 2.4 oz/week    4 Glasses of wine per week     Comment: 4 days per wk  . Drug Use: No  . Sexual Activity: Not on file   Other Topics Concern  . Not on file   Social History Narrative  . No narrative on file     No Known Allergies   Outpatient Prescriptions Prior to Visit  Medication Sig Dispense Refill  . benzonatate (TESSALON) 100 MG capsule 1-2 every 4 hours as needed for cough  30 capsule  6  . fluticasone (FLONASE) 50 MCG/ACT nasal spray Place 2 sprays into the nose daily.  16 g  2  . levothyroxine (SYNTHROID,  LEVOTHROID) 137 MCG tablet 1 tablet Daily.      Marland Kitchen omeprazole-sodium bicarbonate (ZEGERID) 40-1100 MG per capsule Take 1 capsule by mouth 2 (two) times daily.  60 capsule  6  . valACYclovir (VALTREX) 1000 MG tablet as needed.      . SYMBICORT 160-4.5 MCG/ACT inhaler Inhale 2 puffs into the lungs 2 (two) times daily.       Marland Kitchen PROAIR HFA 108 (90 BASE) MCG/ACT inhaler       . clindamycin (CLEOCIN) 300 MG capsule       . predniSONE (DELTASONE) 10 MG tablet Take 4 for three days 3 for three days 2 for three days 1 for three days and stop  30 tablet  0   No facility-administered medications prior to visit.      Review of Systems  Constitutional: Positive for activity change and fatigue. Negative for diaphoresis, appetite change and unexpected weight change.  HENT: Positive for congestion and tinnitus. Negative for dental problem, ear discharge, facial swelling, hearing loss,  mouth sores, nosebleeds, sinus pressure, sneezing, trouble swallowing and voice change.   Eyes: Negative for photophobia, discharge, itching and visual disturbance.  Respiratory: Positive for chest tightness. Negative for apnea, choking and stridor.   Cardiovascular: Negative for palpitations and leg swelling.  Gastrointestinal: Negative for nausea, vomiting, abdominal pain, constipation, blood in stool and abdominal distention.  Genitourinary: Negative for dysuria, urgency, frequency, hematuria, flank pain, decreased urine volume and difficulty urinating.  Musculoskeletal: Negative for arthralgias, back pain, gait problem, joint swelling, neck pain and neck stiffness.  Skin: Negative for color change and pallor.  Neurological: Positive for dizziness. Negative for tremors, seizures, syncope, speech difficulty, weakness, light-headedness and numbness.  Hematological: Negative for adenopathy. Does not bruise/bleed easily.  Psychiatric/Behavioral: Positive for sleep disturbance. Negative for confusion and agitation. The patient is  not nervous/anxious.        Objective:   Physical Exam  Filed Vitals:   08/06/13 0945  BP: 124/80  Pulse: 79  Temp: 97.8 F (36.6 C)  TempSrc: Oral  Height: 5\' 7"  (1.702 m)  Weight: 66.86 kg (147 lb 6.4 oz)  SpO2: 96%    Gen: Pleasant, well-nourished, in no distress,  normal affect  ENT: No lesions,  mouth clear,  oropharynx clear,  ++ postnasal drip, mild rhinits  Neck: No JVD, no TMG, no carotid bruits  Lungs: No use of accessory muscles, no dullness to percussion, prom pseudowheeze in expiratory phase, goes away with purselip breathing  Cardiovascular: RRR, heart sounds normal, no murmur or gallops, no peripheral edema  Abdomen: soft and NT, no HSM,  BS normal  Musculoskeletal: No deformities, no cyanosis or clubbing  Neuro: alert, non focal  Skin: Warm, no lesions or rashes  No results found. Arlyce Harman: F/V loop c/w VCD, normal spiro  FeV1 80% FVC 75% FeV1/FVC 84% CXR: RUL nodule.     Assessment & Plan:   Cough Cyclical cough due to GERD and post nasal drip syndrome and recurrent sinusitis Plan Take tessalon perles as needed for cough Take meloxacam 15mg  daily for 14 days Stay on zegerid and pepcid Follow reflux diet Stay on flonase Take Avelox one daily for 7days Stop symbicort Stay on proair as needed Return 2 months   Sinusitis, chronic Chronic sinusitis See cough eval    Updated Medication List Outpatient Encounter Prescriptions as of 08/06/2013  Medication Sig  . benzonatate (TESSALON) 100 MG capsule 1-2 every 4 hours as needed for cough  . famotidine (PEPCID) 20 MG tablet Take 1 tablet by mouth daily.  . fluticasone (FLONASE) 50 MCG/ACT nasal spray Place 2 sprays into the nose daily.  Marland Kitchen levothyroxine (SYNTHROID, LEVOTHROID) 137 MCG tablet 1 tablet Daily.  . montelukast (SINGULAIR) 10 MG tablet Take 1 tablet by mouth at bedtime.  Marland Kitchen NITROFURANTOIN PO Take by mouth as needed.  Marland Kitchen omeprazole-sodium bicarbonate (ZEGERID) 40-1100 MG per capsule  Take 1 capsule by mouth 2 (two) times daily.  . valACYclovir (VALTREX) 1000 MG tablet as needed.  . [DISCONTINUED] SYMBICORT 160-4.5 MCG/ACT inhaler Inhale 2 puffs into the lungs 2 (two) times daily.   . meloxicam (MOBIC) 15 MG tablet Take 1 tablet (15 mg total) by mouth daily.  Marland Kitchen moxifloxacin (AVELOX) 400 MG tablet Take 1 tablet (400 mg total) by mouth daily.  Marland Kitchen PROAIR HFA 108 (90 BASE) MCG/ACT inhaler   . [DISCONTINUED] clindamycin (CLEOCIN) 300 MG capsule   . [DISCONTINUED] predniSONE (DELTASONE) 10 MG tablet Take 4 for three days 3 for three days 2 for three days 1 for three  days and stop

## 2013-08-06 NOTE — Assessment & Plan Note (Signed)
Cyclical cough due to GERD and post nasal drip syndrome and recurrent sinusitis Plan Take tessalon perles as needed for cough Take meloxacam 15mg  daily for 14 days Stay on zegerid and pepcid Follow reflux diet Stay on flonase Take Avelox one daily for 7days Stop symbicort Stay on proair as needed Return 2 months

## 2013-08-06 NOTE — Patient Instructions (Signed)
Take tessalon perles as needed for cough Take meloxacam 15mg  daily for 14 days Stay on zegerid and pepcid Follow reflux diet Stay on flonase Take Avelox one daily for 7days Stop symbicort Stay on proair as needed Return 2 months

## 2013-08-26 ENCOUNTER — Telehealth: Payer: Self-pay | Admitting: Critical Care Medicine

## 2013-08-26 NOTE — Telephone Encounter (Signed)
Rec'd from [facility] forward 4 pages to [place]

## 2013-08-31 ENCOUNTER — Ambulatory Visit: Payer: BC Managed Care – PPO | Admitting: Internal Medicine

## 2013-10-14 ENCOUNTER — Ambulatory Visit: Payer: BC Managed Care – PPO | Admitting: Internal Medicine

## 2014-03-28 ENCOUNTER — Other Ambulatory Visit: Payer: Self-pay | Admitting: Dermatology

## 2014-05-06 DIAGNOSIS — M79673 Pain in unspecified foot: Secondary | ICD-10-CM

## 2014-08-20 ENCOUNTER — Emergency Department (HOSPITAL_COMMUNITY)
Admission: EM | Admit: 2014-08-20 | Discharge: 2014-08-20 | Disposition: A | Discharge status: Home / Self Care | Payer: BLUE CROSS/BLUE SHIELD | Attending: Emergency Medicine | Admitting: Emergency Medicine

## 2014-08-20 ENCOUNTER — Emergency Department (HOSPITAL_COMMUNITY): Payer: BLUE CROSS/BLUE SHIELD

## 2014-08-20 ENCOUNTER — Encounter (HOSPITAL_COMMUNITY): Payer: Self-pay | Admitting: *Deleted

## 2014-08-20 DIAGNOSIS — Z79899 Other long term (current) drug therapy: Secondary | ICD-10-CM | POA: Diagnosis not present

## 2014-08-20 DIAGNOSIS — K219 Gastro-esophageal reflux disease without esophagitis: Secondary | ICD-10-CM | POA: Diagnosis not present

## 2014-08-20 DIAGNOSIS — R079 Chest pain, unspecified: Secondary | ICD-10-CM | POA: Diagnosis present

## 2014-08-20 DIAGNOSIS — M1990 Unspecified osteoarthritis, unspecified site: Secondary | ICD-10-CM | POA: Insufficient documentation

## 2014-08-20 DIAGNOSIS — Z7951 Long term (current) use of inhaled steroids: Secondary | ICD-10-CM | POA: Insufficient documentation

## 2014-08-20 DIAGNOSIS — E039 Hypothyroidism, unspecified: Secondary | ICD-10-CM | POA: Diagnosis not present

## 2014-08-20 DIAGNOSIS — Z791 Long term (current) use of non-steroidal anti-inflammatories (NSAID): Secondary | ICD-10-CM | POA: Diagnosis not present

## 2014-08-20 DIAGNOSIS — J301 Allergic rhinitis due to pollen: Secondary | ICD-10-CM | POA: Diagnosis not present

## 2014-08-20 DIAGNOSIS — J45901 Unspecified asthma with (acute) exacerbation: Secondary | ICD-10-CM | POA: Diagnosis not present

## 2014-08-20 MED ORDER — IPRATROPIUM-ALBUTEROL 0.5-2.5 (3) MG/3ML IN SOLN
3.0000 mL | Freq: Once | RESPIRATORY_TRACT | Status: AC
  Administered 2014-08-20: 3 mL via RESPIRATORY_TRACT
  Filled 2014-08-20: qty 3

## 2014-08-20 MED ORDER — PREDNISONE 50 MG PO TABS: 50.0000 mg | ORAL_TABLET | Freq: Every day | ORAL | Status: DC

## 2014-08-20 MED ORDER — PSEUDOEPHEDRINE HCL 30 MG PO TABS
30.0000 mg | ORAL_TABLET | Freq: Once | ORAL | Status: AC
  Administered 2014-08-20: 30 mg via ORAL
  Filled 2014-08-20: qty 1

## 2014-08-20 MED ORDER — PREDNISONE 20 MG PO TABS
60.0000 mg | ORAL_TABLET | Freq: Once | ORAL | Status: AC
  Administered 2014-08-20: 60 mg via ORAL
  Filled 2014-08-20: qty 3

## 2014-08-20 MED ORDER — LORATADINE 10 MG PO TABS
10.0000 mg | ORAL_TABLET | Freq: Every day | ORAL | Status: DC
  Administered 2014-08-20: 10 mg via ORAL
  Filled 2014-08-20 (×3): qty 1

## 2014-08-20 MED ORDER — LORATADINE-PSEUDOEPHEDRINE ER 10-240 MG PO TB24: 1.0000 | ORAL_TABLET | Freq: Every day | ORAL | Status: DC

## 2014-08-20 NOTE — ED Notes (Signed)
Pt states that she has had increasing SOB since last night. Pt states that she feels it is her asthma. Pt reports doing a nebulizer this morning with little relief. Pt reports central chest pain as well.

## 2014-08-20 NOTE — ED Provider Notes (Signed)
CSN: [id number]     Arrival date & time 08/20/14  1607 History   First MD Initiated Contact with Patient 08/20/14 [phone]     Chief Complaint  Patient presents with  . Asthma  . Chest Pain   HPI Comments: Pt has a history of sinus troubles and congestion.  She is scheduled for sinus surgery later this month.  Since last night she has had severe sinus congestion.   Clear nasal drainage.  She is constantly having to blow her nose.   She cannot breathe through her nose. Her symptoms increased.  She felt like she was wheezing last night. She has a history of asthma.  She does not take any antihistamines.  She takes dulera and her proair.  Patient is a 55 y.o. female presenting with asthma and chest pain. The history is provided by the patient.  Asthma This is a recurrent problem. The current episode started yesterday. The problem occurs constantly. The problem has been gradually worsening. Associated symptoms include chest pain and shortness of breath. Nothing aggravates the symptoms. Nothing relieves the symptoms.  Chest Pain Associated symptoms: shortness of breath   She has been having some intermittent pain in her left chest.  That has been ongoing for a month.  It seems to be associated with stress.  Sharp, lasts for minutes at a time.  This happens intermittently every few days.  No personal or family history of heart disease.  This started well before her breathing symptoms yesterday.  Past Medical History  Diagnosis Date  . Bunion   . Hammer toe   . Arthritis   . Hypothyroidism   . Asthma     DX made  and treated per allergy MD  . GERD (gastroesophageal reflux disease)    Past Surgical History  Procedure Laterality Date  . Colonoscopy    . Polypectomy    . Cesarean section      x2  . Total knee arthroplasty Right   . Neck surgery      plates and screws in  . Rotator cuff repair Right     right  . Bunionectomy with hammertoe reconstruction Bilateral   . Nasal sinus surgery    .  Great toe arthrodesis, interphalangeal joint     Family History  Problem Relation Age of Onset  . Colon cancer Neg Hx   . Esophageal cancer Neg Hx   . Stomach cancer Neg Hx   . Rectal cancer Neg Hx   . Emphysema Mother   . Melanoma Father   . Heart disease Maternal Uncle   . Melanoma Paternal Uncle    History  Substance Use Topics  . Smoking status: Never Smoker   . Smokeless tobacco: Never Used  . Alcohol Use: 2.4 oz/week    4 Glasses of wine per week     Comment: 4 days per wk   OB History    No data available     Review of Systems  Respiratory: Positive for shortness of breath.   Cardiovascular: Positive for chest pain.  All other systems reviewed and are negative.     Allergies  Review of patient's allergies indicates no known allergies.  Home Medications   Prior to Admission medications   Medication Sig Start Date End Date Taking? Authorizing Provider  ADVAIR HFA 115-21 MCG/ACT inhaler Inhale 2 puffs into the lungs 2 (two) times daily. 08/05/14   Historical Provider, MD  benzonatate (TESSALON) 100 MG capsule 1-2 every 4 hours as needed  for cough 06/18/13   Elsie Stain, MD  famotidine (PEPCID) 20 MG tablet Take 1 tablet by mouth daily.    Historical Provider, MD  fluticasone (FLONASE) 50 MCG/ACT nasal spray Place 2 sprays into the nose daily. 11/04/12   Debbe Odea, MD  levothyroxine (SYNTHROID, LEVOTHROID) 137 MCG tablet 1 tablet Daily. 08/11/11   Historical Provider, MD  loratadine-pseudoephedrine (CLARITIN-D 24 HOUR) 10-240 MG per 24 hr tablet Take 1 tablet by mouth daily. 08/20/14   Dorie Rank, MD  meloxicam (MOBIC) 15 MG tablet Take 1 tablet (15 mg total) by mouth daily. 08/06/13   Elsie Stain, MD  montelukast (SINGULAIR) 10 MG tablet Take 1 tablet by mouth at bedtime.    Historical Provider, MD  moxifloxacin (AVELOX) 400 MG tablet Take 1 tablet (400 mg total) by mouth daily. 08/06/13   Elsie Stain, MD  nitrofurantoin (MACRODANTIN) 100 MG capsule   08/05/14   Historical Provider, MD  NITROFURANTOIN PO Take by mouth as needed.    Historical Provider, MD  omeprazole-sodium bicarbonate (ZEGERID) 40-1100 MG per capsule Take 1 capsule by mouth 2 (two) times daily. 07/01/13   Irene Shipper, MD  predniSONE (DELTASONE) 50 MG tablet Take 1 tablet (50 mg total) by mouth daily. 08/20/14   Dorie Rank, MD  PROAIR HFA 108 (90 BASE) MCG/ACT inhaler  05/24/13   Historical Provider, MD  valACYclovir (VALTREX) 1000 MG tablet as needed. 07/08/11   Historical Provider, MD   BP 136/89 mmHg  Pulse 58  Temp(Src) 98.2 F (36.8 C) (Oral)  Resp 18  SpO2 98% Physical Exam  Constitutional: She appears well-developed and well-nourished. No distress.  HENT:  Head: Normocephalic and atraumatic.  Right Ear: External ear normal.  Left Ear: External ear normal.  Nose: Mucosal edema and rhinorrhea present. No nasal septal hematoma.  Eyes: Conjunctivae are normal. Right eye exhibits no discharge. Left eye exhibits no discharge. No scleral icterus.  Neck: Neck supple. No tracheal deviation present.  Cardiovascular: Normal rate, regular rhythm and intact distal pulses.   Pulmonary/Chest: Effort normal and breath sounds normal. No accessory muscle usage or stridor. No respiratory distress. She has no decreased breath sounds. She has no wheezes. She has no rales.  Abdominal: Soft. Bowel sounds are normal. She exhibits no distension. There is no tenderness. There is no rebound and no guarding.  Musculoskeletal: She exhibits no edema or tenderness.  Neurological: She is alert. She has normal strength. No cranial nerve deficit (no facial droop, extraocular movements intact, no slurred speech) or sensory deficit. She exhibits normal muscle tone. She displays no seizure activity. Coordination normal.  Skin: Skin is warm and dry. No rash noted.  Psychiatric: She has a normal mood and affect.  Nursing note and vitals reviewed.   ED Course  Procedures (including critical care  time) Labs Review Labs Reviewed - No data to display  Imaging Review Dg Chest 2 View  08/20/2014   CLINICAL DATA:  One day history of difficulty breathing  EXAM: CHEST  2 VIEW  COMPARISON:  Chest radiograph May 25, 2013 and chest CT June 24, 2013  FINDINGS: Lungs are clear. Heart size and pulmonary vascularity are normal. No adenopathy. There is evidence of old left clavicle fracture. There is postoperative change in the lower cervical spine. There are breast implants bilaterally.  IMPRESSION: No edema or consolidation.   Electronically Signed   By: Lowella Grip III M.D.   On: 08/20/2014 09:10     EKG Interpretation  Date/Time:  Saturday August 20 2014 07:42:45 EDT Ventricular Rate:  79 PR Interval:  133 QRS Duration: 99 QT Interval:  397 QTC Calculation: 455 R Axis:   64 Text Interpretation:  Sinus rhythm Borderline ST depression, diffuse leads  No old tracing to compare Confirmed by Miia Blanks  MD-J, Lamyah Creed ([id number]) on  08/20/2014 7:47:54 AM      MDM   Final diagnoses:  Allergic rhinitis due to pollen  Asthma attack    Pt noticed some improvement with treatment.  Most of her symptoms are primarily associated with her sinus and nasal congestion and inability to breathe through her nose. She did not have any wheezing after her breathing treatment during my exam. She is not tachypneic or hypoxic. I recommended she take Claritin-D to help with her congestion and her seasonal allergies. Pollen count is very high at this time the year.  Discharge home on course of oral prednisone.    Dorie Rank, MD 08/20/14 [phone]

## 2014-08-20 NOTE — ED Notes (Signed)
Pt. Left with all belongings and refused wheelchair 

## 2014-08-30 ENCOUNTER — Emergency Department (HOSPITAL_COMMUNITY)
Admission: EM | Admit: 2014-08-30 | Discharge: 2014-08-30 | Disposition: A | Discharge status: Home / Self Care | Payer: BLUE CROSS/BLUE SHIELD | Source: Home / Self Care | Attending: Family Medicine | Admitting: Family Medicine

## 2014-08-30 ENCOUNTER — Emergency Department (HOSPITAL_COMMUNITY)
Admission: EM | Admit: 2014-08-30 | Discharge: 2014-08-30 | Disposition: A | Discharge status: Home / Self Care | Payer: Self-pay | Source: Home / Self Care

## 2014-08-30 ENCOUNTER — Encounter (HOSPITAL_COMMUNITY): Payer: Self-pay | Admitting: Emergency Medicine

## 2014-08-30 DIAGNOSIS — J069 Acute upper respiratory infection, unspecified: Secondary | ICD-10-CM | POA: Diagnosis not present

## 2014-08-30 DIAGNOSIS — J029 Acute pharyngitis, unspecified: Secondary | ICD-10-CM

## 2014-08-30 LAB — POCT RAPID STREP A: Streptococcus, Group A Screen (Direct): NEGATIVE

## 2014-08-30 NOTE — ED Provider Notes (Signed)
CSN: [id number]     Arrival date & time 08/30/14  1211 History   First MD Initiated Contact with Patient 08/30/14 1416     Chief Complaint  Patient presents with  . Sore Throat   (Consider location/radiation/quality/duration/timing/severity/associated sxs/prior Treatment) HPI Comments: No flu shot this season Non smoker Unemployed PCP: C. White ENT: Wilburn Cornelia   Patient is a 54 y.o. female presenting with pharyngitis. The history is provided by the patient.  Sore Throat This is a new problem. Episode onset: 08/26/2014. The problem occurs constantly. The problem has not changed since onset.Associated symptoms comments: Subjective fever, fatigue, myalgias.    Past Medical History  Diagnosis Date  . Bunion   . Hammer toe   . Arthritis   . Hypothyroidism   . Asthma     DX made  and treated per allergy MD  . GERD (gastroesophageal reflux disease)    Past Surgical History  Procedure Laterality Date  . Colonoscopy    . Polypectomy    . Cesarean section      x2  . Total knee arthroplasty Right   . Neck surgery      plates and screws in  . Rotator cuff repair Right     right  . Bunionectomy with hammertoe reconstruction Bilateral   . Nasal sinus surgery    . Great toe arthrodesis, interphalangeal joint     Family History  Problem Relation Age of Onset  . Colon cancer Neg Hx   . Esophageal cancer Neg Hx   . Stomach cancer Neg Hx   . Rectal cancer Neg Hx   . Emphysema Mother   . Melanoma Father   . Heart disease Maternal Uncle   . Melanoma Paternal Uncle    History  Substance Use Topics  . Smoking status: Never Smoker   . Smokeless tobacco: Never Used  . Alcohol Use: 2.4 oz/week    4 Glasses of wine per week     Comment: 4 days per wk   OB History    No data available     Review of Systems  Constitutional: Positive for fever and fatigue.  HENT: Positive for congestion and sore throat.   Eyes: Negative.   Respiratory: Negative.   Cardiovascular: Negative.    Gastrointestinal: Negative.   Musculoskeletal: Positive for myalgias.  Skin: Negative.     Allergies  Review of patient's allergies indicates no known allergies.  Home Medications   Prior to Admission medications   Medication Sig Start Date End Date Taking? Authorizing Provider  ADVAIR HFA 115-21 MCG/ACT inhaler Inhale 2 puffs into the lungs 2 (two) times daily. 08/05/14   Historical Provider, MD  albuterol (PROVENTIL) (2.5 MG/3ML) 0.083% nebulizer solution Take 2.5 mg by nebulization every 6 (six) hours as needed for wheezing or shortness of breath.    Historical Provider, MD  benzonatate (TESSALON) 100 MG capsule 1-2 every 4 hours as needed for cough Patient not taking: Reported on 08/20/2014 06/18/13   Elsie Stain, MD  docusate sodium (COLACE) 100 MG capsule Take 100 mg by mouth as needed for mild constipation.    Historical Provider, MD  fluticasone (FLONASE) 50 MCG/ACT nasal spray Place 2 sprays into the nose daily. 11/04/12   Debbe Odea, MD  levothyroxine (SYNTHROID, LEVOTHROID) 137 MCG tablet Take 137 mcg by mouth daily before breakfast.  08/11/11   Historical Provider, MD  loratadine-pseudoephedrine (CLARITIN-D 24 HOUR) 10-240 MG per 24 hr tablet Take 1 tablet by mouth daily. 08/20/14   Wille Glaser  Tomi Bamberger, MD  meloxicam (MOBIC) 15 MG tablet Take 1 tablet (15 mg total) by mouth daily. Patient not taking: Reported on 08/20/2014 08/06/13   Elsie Stain, MD  mometasone-formoterol [facility]) 100-5 MCG/ACT AERO Inhale 2 puffs into the lungs 2 (two) times daily.    Historical Provider, MD  moxifloxacin (AVELOX) 400 MG tablet Take 1 tablet (400 mg total) by mouth daily. Patient not taking: Reported on 08/20/2014 08/06/13   Elsie Stain, MD  nitrofurantoin (MACRODANTIN) 100 MG capsule Take 100 mg by mouth as needed (UTI).  08/05/14   Historical Provider, MD  predniSONE (DELTASONE) 50 MG tablet Take 1 tablet (50 mg total) by mouth daily. 08/20/14   Dorie Rank, MD  PROAIR HFA 108 (90 BASE) MCG/ACT  inhaler Inhale 2 puffs into the lungs every 6 (six) hours as needed for wheezing or shortness of breath.  05/24/13   Historical Provider, MD  sodium chloride (OCEAN) 0.65 % SOLN nasal spray Place 1 spray into both nostrils as needed for congestion. Nasal spray or Neti-pot    Historical Provider, MD  valACYclovir (VALTREX) 1000 MG tablet as needed. 07/08/11   Historical Provider, MD   BP 124/55 mmHg  Pulse 74  Temp(Src) 99.5 F (37.5 C) (Oral)  Resp 20  SpO2 96% Physical Exam  Constitutional: She is oriented to person, place, and time. She appears well-developed and well-nourished. No distress.  HENT:  Head: Normocephalic and atraumatic.  Right Ear: Hearing, tympanic membrane, external ear and ear canal normal.  Left Ear: Hearing, tympanic membrane, external ear and ear canal normal.  Nose: Nose normal.  Mouth/Throat: Uvula is midline and mucous membranes are normal. No oral lesions. No trismus in the jaw. No uvula swelling. Posterior oropharyngeal erythema present. No oropharyngeal exudate, posterior oropharyngeal edema or tonsillar abscesses.  Eyes: Conjunctivae are normal.  Neck: Normal range of motion. Neck supple.  Cardiovascular: Normal rate, regular rhythm and normal heart sounds.   Pulmonary/Chest: Effort normal and breath sounds normal.  Musculoskeletal: Normal range of motion.  Lymphadenopathy:    She has no cervical adenopathy.  Neurological: She is alert and oriented to person, place, and time.  Skin: Skin is warm and dry.  Psychiatric: She has a normal mood and affect. Her behavior is normal.  Nursing note and vitals reviewed.   ED Course  Procedures (including critical care time) Labs Review Labs Reviewed  POCT RAPID STREP A ([facility] CARE ONLY)    Imaging Review No results found.   MDM   1. URI (upper respiratory infection)   2. Pharyngitis     Exam and vital signs reassuring. Suspect viral URI Rapid strep negative Will advise if throat culture indicates  need for additional treatment Symptomatic care at home. Fluids, rest, salt water gargles, tylenol or ibuprofen as directed on packaging PCP or ENT follow up if no improvement    Lutricia Feil, PA 08/30/14 1437

## 2014-08-30 NOTE — ED Notes (Signed)
Sore throat that started Thursday 3/31.  Reports swollen glands, pain worsened on Sunday, increased feeling of being tired, lethargy

## 2014-08-30 NOTE — Discharge Instructions (Signed)
Exam and vital signs reassuring. Suspect symptoms are of viral origin. Common cold with associated sore throat  Rapid strep test negative Will advise by phone if three day throat culture indicates need for additional treatment Symptomatic care at home. Fluids, rest, salt water gargles, tylenol or ibuprofen as directed on packaging Primary care or ENT follow up if no improvement Salt Water Gargle This solution will help make your mouth and throat feel better. HOME CARE INSTRUCTIONS   Mix 1 teaspoon of salt in 8 ounces of warm water.  Gargle with this solution as much or often as you need or as directed. Swish and gargle gently if you have any sores or wounds in your mouth.  Do not swallow this mixture. Document Released: 02/15/2004 Document Revised: 08/05/2011 Document Reviewed: 07/08/2008 [facility] Patient Information 2015 [place], [place]. This information is not intended to replace advice given to you by your health care provider. Make sure you discuss any questions you have with your health care provider.  Strep Throat Tests While most sore throats are caused by viruses, at times they are caused by a bacteria called group A Streptococci (strep throat). It is important to determine the cause because the strep bacteria is treated with antibiotic medication. There are 2 types of tests for strep throat: a rapid strep test and a throat culture. Both tests are done by wiping a swab over the back of the throat and then using chemicals to identify the type of bacteria present. The rapid strep test takes 10 to 20 minutes. If the rapid strep test is negative, a throat culture may be performed to confirm the results. With a throat culture, the swab is used to spread the bacteria on a gel plate and grow it in a lab, which may take 1 to 2 days. In some cases, the culture will detect strep bacteria not found with the rapid strep test. If the result of the rapid strep test is positive, no further testing is  needed, and your caregiver will prescribe antibiotics. Not all test results are available during your visit. If your test results are not back during the visit, make an appointment with your caregiver to find out the results. Do not assume everything is normal if you have not heard from your caregiver or the medical facility. It is important for you to follow up on all of your test results. SEEK MEDICAL CARE IF:   Your symptoms are not improving within 1 to 2 days, or you are getting worse.  You have any other questions or concerns. SEEK IMMEDIATE MEDICAL CARE IF:   You have increased difficulty with swallowing.  You develop trouble breathing.  You have a fever. Document Released: 06/20/2004 Document Revised: 08/05/2011 Document Reviewed: 08/18/2013 [facility] Patient Information 2015 [place], [place]. This information is not intended to replace advice given to you by your health care provider. Make sure you discuss any questions you have with your health care provider.  Pharyngitis Pharyngitis is redness, pain, and swelling (inflammation) of your pharynx.  CAUSES  Pharyngitis is usually caused by infection. Most of the time, these infections are from viruses (viral) and are part of a cold. However, sometimes pharyngitis is caused by bacteria (bacterial). Pharyngitis can also be caused by allergies. Viral pharyngitis may be spread from person to person by coughing, sneezing, and personal items or utensils (cups, forks, spoons, toothbrushes). Bacterial pharyngitis may be spread from person to person by more intimate contact, such as kissing.  SIGNS AND SYMPTOMS  Symptoms  of pharyngitis include:   Sore throat.   Tiredness (fatigue).   Low-grade fever.   Headache.  Joint pain and muscle aches.  Skin rashes.  Swollen lymph nodes.  Plaque-like film on throat or tonsils (often seen with bacterial pharyngitis). DIAGNOSIS  Your health care provider will ask you questions about your  illness and your symptoms. Your medical history, along with a physical exam, is often all that is needed to diagnose pharyngitis. Sometimes, a rapid strep test is done. Other lab tests may also be done, depending on the suspected cause.  TREATMENT  Viral pharyngitis will usually get better in 3-4 days without the use of medicine. Bacterial pharyngitis is treated with medicines that kill germs (antibiotics).  HOME CARE INSTRUCTIONS   Drink enough water and fluids to keep your urine clear or pale yellow.   Only take over-the-counter or prescription medicines as directed by your health care provider:   If you are prescribed antibiotics, make sure you finish them even if you start to feel better.   Do not take aspirin.   Get lots of rest.   Gargle with 8 oz of salt water ( tsp of salt per 1 qt of water) as often as every 1-2 hours to soothe your throat.   Throat lozenges (if you are not at risk for choking) or sprays may be used to soothe your throat. SEEK MEDICAL CARE IF:   You have large, tender lumps in your neck.  You have a rash.  You cough up green, yellow-brown, or bloody spit. SEEK IMMEDIATE MEDICAL CARE IF:   Your neck becomes stiff.  You drool or are unable to swallow liquids.  You vomit or are unable to keep medicines or liquids down.  You have severe pain that does not go away with the use of recommended medicines.  You have trouble breathing (not caused by a stuffy nose). MAKE SURE YOU:   Understand these instructions.  Will watch your condition.  Will get help right away if you are not doing well or get worse. Document Released: 05/13/2005 Document Revised: 03/03/2013 Document Reviewed: 01/18/2013 [facility] Patient Information 2015 [place], [place]. This information is not intended to replace advice given to you by your health care provider. Make sure you discuss any questions you have with your health care provider.  Upper Respiratory Infection, Adult An  upper respiratory infection (URI) is also sometimes known as the common cold. The upper respiratory tract includes the nose, sinuses, throat, trachea, and bronchi. Bronchi are the airways leading to the lungs. Most people improve within 1 week, but symptoms can last up to 2 weeks. A residual cough may last even longer.  CAUSES Many different viruses can infect the tissues lining the upper respiratory tract. The tissues become irritated and inflamed and often become very moist. Mucus production is also common. A cold is contagious. You can easily spread the virus to others by oral contact. This includes kissing, sharing a glass, coughing, or sneezing. Touching your mouth or nose and then touching a surface, which is then touched by another person, can also spread the virus. SYMPTOMS  Symptoms typically develop 1 to 3 days after you come in contact with a cold virus. Symptoms vary from person to person. They may include:  Runny nose.  Sneezing.  Nasal congestion.  Sinus irritation.  Sore throat.  Loss of voice (laryngitis).  Cough.  Fatigue.  Muscle aches.  Loss of appetite.  Headache.  Low-grade fever. DIAGNOSIS  You might diagnose your  own cold based on familiar symptoms, since most people get a cold 2 to 3 times a year. Your caregiver can confirm this based on your exam. Most importantly, your caregiver can check that your symptoms are not due to another disease such as strep throat, sinusitis, pneumonia, asthma, or epiglottitis. Blood tests, throat tests, and X-rays are not necessary to diagnose a common cold, but they may sometimes be helpful in excluding other more serious diseases. Your caregiver will decide if any further tests are required. RISKS AND COMPLICATIONS  You may be at risk for a more severe case of the common cold if you smoke cigarettes, have chronic heart disease (such as heart failure) or lung disease (such as asthma), or if you have a weakened immune system. The  very young and very old are also at risk for more serious infections. Bacterial sinusitis, middle ear infections, and bacterial pneumonia can complicate the common cold. The common cold can worsen asthma and chronic obstructive pulmonary disease (COPD). Sometimes, these complications can require emergency medical care and may be life-threatening. PREVENTION  The best way to protect against getting a cold is to practice good hygiene. Avoid oral or hand contact with people with cold symptoms. Wash your hands often if contact occurs. There is no clear evidence that vitamin C, vitamin E, echinacea, or exercise reduces the chance of developing a cold. However, it is always recommended to get plenty of rest and practice good nutrition. TREATMENT  Treatment is directed at relieving symptoms. There is no cure. Antibiotics are not effective, because the infection is caused by a virus, not by bacteria. Treatment may include:  Increased fluid intake. Sports drinks offer valuable electrolytes, sugars, and fluids.  Breathing heated mist or steam (vaporizer or shower).  Eating chicken soup or other clear broths, and maintaining good nutrition.  Getting plenty of rest.  Using gargles or lozenges for comfort.  Controlling fevers with ibuprofen or acetaminophen as directed by your caregiver.  Increasing usage of your inhaler if you have asthma. Zinc gel and zinc lozenges, taken in the first 24 hours of the common cold, can shorten the duration and lessen the severity of symptoms. Pain medicines may help with fever, muscle aches, and throat pain. A variety of non-prescription medicines are available to treat congestion and runny nose. Your caregiver can make recommendations and may suggest nasal or lung inhalers for other symptoms.  HOME CARE INSTRUCTIONS   Only take over-the-counter or prescription medicines for pain, discomfort, or fever as directed by your caregiver.  Use a warm mist humidifier or inhale  steam from a shower to increase air moisture. This may keep secretions moist and make it easier to breathe.  Drink enough water and fluids to keep your urine clear or pale yellow.  Rest as needed.  Return to work when your temperature has returned to normal or as your caregiver advises. You may need to stay home longer to avoid infecting others. You can also use a face mask and careful hand washing to prevent spread of the virus. SEEK MEDICAL CARE IF:   After the first few days, you feel you are getting worse rather than better.  You need your caregiver's advice about medicines to control symptoms.  You develop chills, worsening shortness of breath, or brown or red sputum. These may be signs of pneumonia.  You develop yellow or brown nasal discharge or pain in the face, especially when you bend forward. These may be signs of sinusitis.  You develop  a fever, swollen neck glands, pain with swallowing, or white areas in the back of your throat. These may be signs of strep throat. SEEK IMMEDIATE MEDICAL CARE IF:   You have a fever.  You develop severe or persistent headache, ear pain, sinus pain, or chest pain.  You develop wheezing, a prolonged cough, cough up blood, or have a change in your usual mucus (if you have chronic lung disease).  You develop sore muscles or a stiff neck. Document Released: 11/06/2000 Document Revised: 08/05/2011 Document Reviewed: 08/18/2013 [facility] Patient Information 2015 [place], [place]. This information is not intended to replace advice given to you by your health care provider. Make sure you discuss any questions you have with your health care provider.  Sore Throat A sore throat is pain, burning, irritation, or scratchiness of the throat. There is often pain or tenderness when swallowing or talking. A sore throat may be accompanied by other symptoms, such as coughing, sneezing, fever, and swollen neck glands. A sore throat is often the first sign of  another sickness, such as a cold, flu, strep throat, or mononucleosis (commonly known as mono). Most sore throats go away without medical treatment. CAUSES  The most common causes of a sore throat include:  A viral infection, such as a cold, flu, or mono.  A bacterial infection, such as strep throat, tonsillitis, or whooping cough.  Seasonal allergies.  Dryness in the air.  Irritants, such as smoke or pollution.  Gastroesophageal reflux disease (GERD). HOME CARE INSTRUCTIONS   Only take over-the-counter medicines as directed by your caregiver.  Drink enough fluids to keep your urine clear or pale yellow.  Rest as needed.  Try using throat sprays, lozenges, or sucking on hard candy to ease any pain (if older than 4 years or as directed).  Sip warm liquids, such as broth, herbal tea, or warm water with honey to relieve pain temporarily. You may also eat or drink cold or frozen liquids such as frozen ice pops.  Gargle with salt water (mix 1 tsp salt with 8 oz of water).  Do not smoke and avoid secondhand smoke.  Put a cool-mist humidifier in your bedroom at night to moisten the air. You can also turn on a hot shower and sit in the bathroom with the door closed for 5-10 minutes. SEEK IMMEDIATE MEDICAL CARE IF:  You have difficulty breathing.  You are unable to swallow fluids, soft foods, or your saliva.  You have increased swelling in the throat.  Your sore throat does not get better in 7 days.  You have nausea and vomiting.  You have a fever or persistent symptoms for more than 2-3 days.  You have a fever and your symptoms suddenly get worse. MAKE SURE YOU:   Understand these instructions.  Will watch your condition.  Will get help right away if you are not doing well or get worse. Document Released: 06/20/2004 Document Revised: 04/29/2012 Document Reviewed: 01/19/2012 [facility] Patient Information 2015 [place], [place]. This information is not intended to replace  advice given to you by your health care provider. Make sure you discuss any questions you have with your health care provider.

## 2014-09-01 LAB — CULTURE, GROUP A STREP: Strep A Culture: NEGATIVE

## 2014-09-08 ENCOUNTER — Other Ambulatory Visit: Payer: Self-pay | Admitting: Otolaryngology

## 2014-09-28 ENCOUNTER — Other Ambulatory Visit (HOSPITAL_COMMUNITY): Payer: Self-pay | Admitting: Obstetrics and Gynecology

## 2014-09-29 LAB — CYTOLOGY - PAP

## 2015-01-17 ENCOUNTER — Emergency Department (HOSPITAL_COMMUNITY): Payer: BLUE CROSS/BLUE SHIELD

## 2015-01-17 ENCOUNTER — Emergency Department (HOSPITAL_COMMUNITY)
Admission: EM | Admit: 2015-01-17 | Discharge: 2015-01-17 | Disposition: A | Discharge status: Home / Self Care | Payer: BLUE CROSS/BLUE SHIELD | Attending: Emergency Medicine | Admitting: Emergency Medicine

## 2015-01-17 ENCOUNTER — Encounter (HOSPITAL_COMMUNITY): Payer: Self-pay | Admitting: *Deleted

## 2015-01-17 DIAGNOSIS — R7989 Other specified abnormal findings of blood chemistry: Secondary | ICD-10-CM

## 2015-01-17 DIAGNOSIS — K219 Gastro-esophageal reflux disease without esophagitis: Secondary | ICD-10-CM | POA: Insufficient documentation

## 2015-01-17 DIAGNOSIS — J4521 Mild intermittent asthma with (acute) exacerbation: Secondary | ICD-10-CM | POA: Insufficient documentation

## 2015-01-17 DIAGNOSIS — Z79899 Other long term (current) drug therapy: Secondary | ICD-10-CM | POA: Diagnosis not present

## 2015-01-17 DIAGNOSIS — J45909 Unspecified asthma, uncomplicated: Secondary | ICD-10-CM | POA: Diagnosis present

## 2015-01-17 DIAGNOSIS — E039 Hypothyroidism, unspecified: Secondary | ICD-10-CM | POA: Insufficient documentation

## 2015-01-17 DIAGNOSIS — R000 Tachycardia, unspecified: Secondary | ICD-10-CM | POA: Insufficient documentation

## 2015-01-17 DIAGNOSIS — Z7951 Long term (current) use of inhaled steroids: Secondary | ICD-10-CM | POA: Insufficient documentation

## 2015-01-17 DIAGNOSIS — M1990 Unspecified osteoarthritis, unspecified site: Secondary | ICD-10-CM | POA: Diagnosis not present

## 2015-01-17 DIAGNOSIS — R0602 Shortness of breath: Secondary | ICD-10-CM

## 2015-01-17 LAB — D-DIMER, QUANTITATIVE: D-Dimer, Quant: 0.64 ug/mL-FEU — ABNORMAL HIGH (ref 0.00–0.48)

## 2015-01-17 LAB — CBC WITH DIFFERENTIAL/PLATELET
Basophils Absolute: 0.1 10*3/uL (ref 0.0–0.1)
Basophils Relative: 1 % (ref 0–1)
Eosinophils Absolute: 0.7 10*3/uL (ref 0.0–0.7)
Eosinophils Relative: 6 % — ABNORMAL HIGH (ref 0–5)
HCT: 46.9 % — ABNORMAL HIGH (ref 36.0–46.0)
Hemoglobin: 15.9 g/dL — ABNORMAL HIGH (ref 12.0–15.0)
LYMPHS PCT: 24 % (ref 12–46)
Lymphs Abs: 2.7 10*3/uL (ref 0.7–4.0)
MCH: 31.5 pg (ref 26.0–34.0)
MCHC: 33.9 g/dL (ref 30.0–36.0)
MCV: 92.9 fL (ref 78.0–100.0)
MONO ABS: 0.9 10*3/uL (ref 0.1–1.0)
Monocytes Relative: 8 % (ref 3–12)
Neutro Abs: 6.8 10*3/uL (ref 1.7–7.7)
Neutrophils Relative %: 61 % (ref 43–77)
Platelets: 198 10*3/uL (ref 150–400)
RBC: 5.05 MIL/uL (ref 3.87–5.11)
RDW: 13.6 % (ref 11.5–15.5)
WBC: 11.1 10*3/uL — ABNORMAL HIGH (ref 4.0–10.5)

## 2015-01-17 LAB — BASIC METABOLIC PANEL
Anion gap: 12 (ref 5–15)
BUN: 8 mg/dL (ref 6–20)
CHLORIDE: 107 mmol/L (ref 101–111)
CO2: 24 mmol/L (ref 22–32)
Calcium: 9.3 mg/dL (ref 8.9–10.3)
Creatinine, Ser: 0.67 mg/dL (ref 0.44–1.00)
GFR calc Af Amer: >60 mL/min (ref >60)
GLUCOSE: 86 mg/dL (ref 65–99)
Potassium: 3.6 mmol/L (ref 3.5–5.1)
Sodium: 143 mmol/L (ref 135–145)

## 2015-01-17 MED ORDER — ALBUTEROL SULFATE (2.5 MG/3ML) 0.083% IN NEBU
5.0000 mg | INHALATION_SOLUTION | Freq: Once | RESPIRATORY_TRACT | Status: AC
  Administered 2015-01-17: 5 mg via RESPIRATORY_TRACT
  Filled 2015-01-17: qty 6

## 2015-01-17 MED ORDER — PREDNISONE 50 MG PO TABS: 50.0000 mg | ORAL_TABLET | Freq: Every day | ORAL | Status: DC

## 2015-01-17 MED ORDER — IPRATROPIUM-ALBUTEROL 0.5-2.5 (3) MG/3ML IN SOLN
RESPIRATORY_TRACT | Status: AC
  Filled 2015-01-17: qty 3

## 2015-01-17 MED ORDER — PREDNISONE 20 MG PO TABS
60.0000 mg | ORAL_TABLET | Freq: Once | ORAL | Status: DC
Start: 2015-01-17 — End: 2015-01-17

## 2015-01-17 MED ORDER — IPRATROPIUM BROMIDE 0.02 % IN SOLN
0.5000 mg | Freq: Once | RESPIRATORY_TRACT | Status: AC
  Administered 2015-01-17: 0.5 mg via RESPIRATORY_TRACT
  Filled 2015-01-17: qty 2.5

## 2015-01-17 MED ORDER — ALBUTEROL SULFATE HFA 108 (90 BASE) MCG/ACT IN AERS
2.0000 | INHALATION_SPRAY | Freq: Once | RESPIRATORY_TRACT | Status: DC | PRN
  Administered 2015-01-17: 2 via RESPIRATORY_TRACT
  Filled 2015-01-17: qty 6.7

## 2015-01-17 MED ORDER — ALBUTEROL SULFATE (2.5 MG/3ML) 0.083% IN NEBU
INHALATION_SOLUTION | RESPIRATORY_TRACT | Status: AC
  Filled 2015-01-17: qty 3

## 2015-01-17 MED ORDER — IPRATROPIUM-ALBUTEROL 0.5-2.5 (3) MG/3ML IN SOLN
3.0000 mL | Freq: Once | RESPIRATORY_TRACT | Status: AC
  Administered 2015-01-17: 3 mL via RESPIRATORY_TRACT
  Filled 2015-01-17: qty 3

## 2015-01-17 MED ORDER — METHYLPREDNISOLONE SODIUM SUCC 125 MG IJ SOLR
125.0000 mg | Freq: Once | INTRAMUSCULAR | Status: AC
  Administered 2015-01-17: 125 mg via INTRAVENOUS
  Filled 2015-01-17: qty 2

## 2015-01-17 MED ORDER — IOHEXOL 350 MG/ML SOLN
100.0000 mL | Freq: Once | INTRAVENOUS | Status: AC | PRN
  Administered 2015-01-17: 100 mL via INTRAVENOUS

## 2015-01-17 NOTE — ED Notes (Signed)
MD at bedside. 

## 2015-01-17 NOTE — Discharge Instructions (Signed)
Asthma Asthma is a recurring condition in which the airways tighten and narrow. Asthma can make it difficult to breathe. It can cause coughing, wheezing, and shortness of breath. Asthma episodes, also called asthma attacks, range from minor to life-threatening. Asthma cannot be cured, but medicines and lifestyle changes can help control it. CAUSES Asthma is believed to be caused by inherited (genetic) and environmental factors, but its exact cause is unknown. Asthma may be triggered by allergens, lung infections, or irritants in the air. Asthma triggers are different for each person. Common triggers include:   Animal dander.  Dust mites.  Cockroaches.  Pollen from trees or grass.  Mold.  Smoke.  Air pollutants such as dust, household cleaners, hair sprays, aerosol sprays, paint fumes, strong chemicals, or strong odors.  Cold air, weather changes, and winds (which increase molds and pollens in the air).  Strong emotional expressions such as crying or laughing hard.  Stress.  Certain medicines (such as aspirin) or types of drugs (such as beta-blockers).  Sulfites in foods and drinks. Foods and drinks that may contain sulfites include dried fruit, potato chips, and sparkling grape juice.  Infections or inflammatory conditions such as the flu, a cold, or an inflammation of the nasal membranes (rhinitis).  Gastroesophageal reflux disease (GERD).  Exercise or strenuous activity. SYMPTOMS Symptoms may occur immediately after asthma is triggered or many hours later. Symptoms include:  Wheezing.  Excessive nighttime or early morning coughing.  Frequent or severe coughing with a common cold.  Chest tightness.  Shortness of breath. DIAGNOSIS  The diagnosis of asthma is made by a review of your medical history and a physical exam. Tests may also be performed. These may include:  Lung function studies. These tests show how much air you breathe in and out.  Allergy  tests.  Imaging tests such as X-rays. TREATMENT  Asthma cannot be cured, but it can usually be controlled. Treatment involves identifying and avoiding your asthma triggers. It also involves medicines. There are 2 classes of medicine used for asthma treatment:   Controller medicines. These prevent asthma symptoms from occurring. They are usually taken every day.  Reliever or rescue medicines. These quickly relieve asthma symptoms. They are used as needed and provide short-term relief. HOME CARE INSTRUCTIONS   Take medicines only as directed by your health care provider. Speak with your health care provider if you have questions about how or when to take the medicines.  Use a peak flow meter as directed by your health care provider. Record and keep track of readings.  Understand and use the action plan to help minimize or stop an asthma attack without needing to seek medical care.  Control your home environment in the following ways to help prevent asthma attacks:  Do not smoke. Avoid being exposed to secondhand smoke.  Change your heating and air conditioning filter regularly.  Limit your use of fireplaces and wood stoves.  Get rid of pests (such as roaches and mice) and their droppings.  Throw away plants if you see mold on them.  Clean your floors and dust regularly. Use unscented cleaning products.  Try to have someone else vacuum for you regularly. Stay out of rooms while they are being vacuumed and for a short while afterward. If you vacuum, use a dust mask from a hardware store, a double-layered or microfilter vacuum cleaner bag, or a vacuum cleaner with a HEPA filter.  Replace carpet with wood, tile, or vinyl flooring. Carpet can trap dander and dust.  Use allergy-proof pillows, mattress covers, and box spring covers.  Wash bed sheets and blankets every week in hot water and dry them in a dryer.  Use blankets that are made of polyester or cotton.  Clean bathrooms and  kitchens with bleach. If possible, have someone repaint the walls in these rooms with mold-resistant paint. Keep out of the rooms that are being cleaned and painted.  Wash hands frequently. SEEK MEDICAL CARE IF:   You have wheezing, shortness of breath, or a cough even if taking medicine to prevent attacks.  The colored mucus you cough up (sputum) is thicker than usual.  Your sputum changes from clear or white to yellow, green, gray, or bloody.  You have any problems that may be related to the medicines you are taking (such as a rash, itching, swelling, or trouble breathing).  You are using a reliever medicine more than 2-3 times per week.  Your peak flow is still at 50-79% of your personal best after following your action plan for 1 hour.  You have a fever. SEEK IMMEDIATE MEDICAL CARE IF:   You seem to be getting worse and are unresponsive to treatment during an asthma attack.  You are short of breath even at rest.  You get short of breath when doing very little physical activity.  You have difficulty eating, drinking, or talking due to asthma symptoms.  You develop chest pain.  You develop a fast heartbeat.  You have a bluish color to your lips or fingernails.  You are light-headed, dizzy, or faint.  Your peak flow is less than 50% of your personal best. MAKE SURE YOU:   Understand these instructions.  Will watch your condition.  Will get help right away if you are not doing well or get worse. Document Released: 05/13/2005 Document Revised: 09/27/2013 Document Reviewed: 12/10/2012 [facility] Patient Information 2015 [place], [place]. This information is not intended to replace advice given to you by your health care provider. Make sure you discuss any questions you have with your health care provider.

## 2015-01-17 NOTE — ED Notes (Signed)
Patient following nebulizer treatment maintaining O2 of 88% on room air.  MD made aware, see new orders.

## 2015-01-17 NOTE — ED Provider Notes (Signed)
CSN: [id number]     Arrival date & time 01/17/15  1105 History   First MD Initiated Contact with Patient 01/17/15 1111     Chief Complaint  Patient presents with  . Asthma   HPI  [name] is a 54yo female presenting today with asthma exacerbation. Started 7minutes prior to arrival. No relief with albuterol inhaler. States symptoms were very acute in onset. Reports congestion since getting up this morning, but felt fine yesterday. Had recent trip to [place]. Noted to be in respiratory distress by triage with hypoxia to high 80s and initiated on albuterol and atrovent nebulizer treatment. Has history of asthma.  Past Medical History  Diagnosis Date  . Bunion   . Hammer toe   . Arthritis   . Hypothyroidism   . Asthma     DX made  and treated per allergy MD  . GERD (gastroesophageal reflux disease)    Past Surgical History  Procedure Laterality Date  . Colonoscopy    . Polypectomy    . Cesarean section      x2  . Total knee arthroplasty Right   . Neck surgery      plates and screws in  . Rotator cuff repair Right     right  . Bunionectomy with hammertoe reconstruction Bilateral   . Nasal sinus surgery    . Great toe arthrodesis, interphalangeal joint     Family History  Problem Relation Age of Onset  . Colon cancer Neg Hx   . Esophageal cancer Neg Hx   . Stomach cancer Neg Hx   . Rectal cancer Neg Hx   . Emphysema Mother   . Melanoma Father   . Heart disease Maternal Uncle   . Melanoma Paternal Uncle    Social History  Substance Use Topics  . Smoking status: Never Smoker   . Smokeless tobacco: Never Used  . Alcohol Use: 2.4 oz/week    4 Glasses of wine per week     Comment: 4 days per wk   OB History    No data available     Review of Systems  Respiratory: Positive for cough, shortness of breath and wheezing.       Allergies  Review of patient's allergies indicates no known allergies.  Home Medications   Prior to Admission medications   Medication Sig  Start Date End Date Taking? Authorizing Provider  ADVAIR HFA 115-21 MCG/ACT inhaler Inhale 2 puffs into the lungs 2 (two) times daily. 08/05/14   Historical Provider, MD  albuterol (PROVENTIL) (2.5 MG/3ML) 0.083% nebulizer solution Take 2.5 mg by nebulization every 6 (six) hours as needed for wheezing or shortness of breath.    Historical Provider, MD  benzonatate (TESSALON) 100 MG capsule 1-2 every 4 hours as needed for cough Patient not taking: Reported on 08/20/2014 06/18/13   Elsie Stain, MD  docusate sodium (COLACE) 100 MG capsule Take 100 mg by mouth as needed for mild constipation.    Historical Provider, MD  fluticasone (FLONASE) 50 MCG/ACT nasal spray Place 2 sprays into the nose daily. 11/04/12   Debbe Odea, MD  levothyroxine (SYNTHROID, LEVOTHROID) 137 MCG tablet Take 137 mcg by mouth daily before breakfast.  08/11/11   Historical Provider, MD  loratadine-pseudoephedrine (CLARITIN-D 24 HOUR) 10-240 MG per 24 hr tablet Take 1 tablet by mouth daily. 08/20/14   Dorie Rank, MD  meloxicam (MOBIC) 15 MG tablet Take 1 tablet (15 mg total) by mouth daily. Patient not taking: Reported on 08/20/2014 08/06/13  Elsie Stain, MD  mometasone-formoterol [facility]) 100-5 MCG/ACT AERO Inhale 2 puffs into the lungs 2 (two) times daily.    Historical Provider, MD  moxifloxacin (AVELOX) 400 MG tablet Take 1 tablet (400 mg total) by mouth daily. Patient not taking: Reported on 08/20/2014 08/06/13   Elsie Stain, MD  nitrofurantoin (MACRODANTIN) 100 MG capsule Take 100 mg by mouth as needed (UTI).  08/05/14   Historical Provider, MD  predniSONE (DELTASONE) 50 MG tablet Take 1 tablet (50 mg total) by mouth daily. 08/20/14   Dorie Rank, MD  PROAIR HFA 108 (90 BASE) MCG/ACT inhaler Inhale 2 puffs into the lungs every 6 (six) hours as needed for wheezing or shortness of breath.  05/24/13   Historical Provider, MD  sodium chloride (OCEAN) 0.65 % SOLN nasal spray Place 1 spray into both nostrils as needed for  congestion. Nasal spray or Neti-pot    Historical Provider, MD  valACYclovir (VALTREX) 1000 MG tablet as needed. 07/08/11   Historical Provider, MD   BP 160/87 mmHg  Pulse 96  Resp 24  SpO2 87% Physical Exam  Constitutional: She appears well-developed and well-nourished.  HENT:  Head: Normocephalic and atraumatic.  Cardiovascular: Regular rhythm.  Exam reveals no gallop and no friction rub.   No murmur heard. Tachycardic  Pulmonary/Chest:  Increased work of breathing. Wheezing noted bilaterally.  Abdominal: Soft. Bowel sounds are normal. She exhibits no distension. There is no tenderness.  Musculoskeletal: She exhibits no edema.  Skin: Skin is warm and dry. No rash noted.    ED Course  Procedures (including critical care time) Labs Review Labs Reviewed - No data to display  Imaging Review No results found. I have personally reviewed and evaluated these images and lab results as part of my medical decision-making.   EKG Interpretation None      MDM   Final diagnoses:  None   CBC with mild Leukocytosis of 11.1. BMP normal.  CXR with mild hyperinflation but no signs of acute infiltrate. D-dimer was obtained due to recent travel, acute onset shortness of breath, and tachycardia and was noted to be elevated to 0.64. CT scan of lungs without evidence of pulmonary embolus. Was given duoneb x3 and one dose of solumedrol. Wheezing improved significantly following duonebs and was satting in high 90s. Stable for discharge with use of albuterol 2puffs q4hr for the next two days and close follow up with PCP. Given short burst of steroids at discharge.   [place], [place] 01/17/15 [place], DO 01/17/15 1525

## 2015-01-17 NOTE — ED Provider Notes (Signed)
MSE was initiated and I personally evaluated the patient and placed orders (if any) at  11:12 AM on January 17, 2015.  KIERAN NACHTIGAL is a 54 y.o. female presents with asthma exacerbation onset approx 20 min PTA.  Pt with hx of asthma.  Rescue inhaler without improvement at home.  Pt with respiratory distress.    Face to face Exam:   General: Awake, crying  HEENT: Atraumatic  Resp: Increased effort, wheezes throughout, hypoxia to 88%, accessory muscle usage and retractions  Abd: Nondistended  Neuro:No focal weakness   Pt with asthma exacerbation.  Albuterol, atrovent and solumedrol ordered.  Pt to be moved to acute care bed at this time due to hypoxia.    The patient appears stable so that the remainder of the MSE may be completed by another provider.  Abigail Butts, PA-C 01/17/15 [place], DO 01/17/15 1517

## 2015-01-17 NOTE — ED Notes (Signed)
Pt states that she woke up feeling like she couldn't breath. Pt reports adult onset of asthma. Pt noted to have spO2 at 87% in triage.

## 2015-01-17 NOTE — ED Provider Notes (Signed)
I have personally seen and examined the patient.  I have discussed the plan of care with the resident.  I have reviewed the documentation on PMH/FH/Soc. History.  I have reviewed the documentation of the resident and agree.  54 year old female with sudden onset shortness of breath this morning. Associated with some left-sided sharp chest pain. Patient recently flew back from [place] yesterday. Symptoms consistent with asthma exacerbation however with recent travel in the sharp chest pain will obtain a d-dimer.  My exam was performed after 3 separate neb treatments. Patient's lungs clear mildly diminished air movement and prolonged expiration. No noted lower extreme edema 2+ DP and radial pulses.  Patient was initially hypoxic not improved after breathing treatments will admit to the hospital.  Readings initially improved with breathing treatments. CT PE scan negative. DC home.  Deno Etienne, DO 01/17/15 1517

## 2015-06-30 ENCOUNTER — Telehealth: Payer: Self-pay | Admitting: Internal Medicine

## 2015-06-30 NOTE — Telephone Encounter (Signed)
Called and spoke to pt. Pt aware of 07/11/15 appt. Office notes are in MR's lookats. Nothing further needed at this time.

## 2015-06-30 NOTE — Telephone Encounter (Signed)
Triage/Elise  Dr Remus Blake wants me to see this patient pe email below. I opened several slots mid feb 2017. Please work her in 30 min slot . Will need Dr Remus Blake office recs  Thanks  Dr. Brand Males, M.D., [facility].C.P Pulmonary and Critical Care Medicine Staff Physician [place] [facility] Pager: [phone], If no answer or between  15:00h - 7:00h: call [phone]  06/30/2015 12:48 PM     ............... Hi Marwin Primmer,  It's been a little while! Hope that all is well. Happy New Year!   I'm sending a patient to you named [name] DOB [birth year]. She was evaluated by Dr Joya Gaskins in the past.  She has had recurrent dyspneic episodes over the past few years which have worsened over the past few months despite ICS/Laba treatment. Her aeroallergen testing has been completely negative. Today I saw her for an episode. She was actively wheezing with a pulse ox of 93%. I did not perform one today but a FENO done in March 2015 resulted as 91 ppb. She follows with Dr Wilburn Cornelia for sinus disease and is s/p ENSS. Today I sent an immune work up, IgE, ANCAs and ABPA labs. She has had a clear CT chest in the past. I will send you all documentation. I'm asking you to see her as she is not atopic and am not sure what else could be causing such frequent and severe episodes.   Thank you in advance for seeing her. Hope to see you soon.   Sincerely, Meg 06/22/15 via email

## 2015-07-11 ENCOUNTER — Encounter: Payer: Self-pay | Admitting: Internal Medicine

## 2015-07-11 ENCOUNTER — Ambulatory Visit (INDEPENDENT_AMBULATORY_CARE_PROVIDER_SITE_OTHER): Payer: BLUE CROSS/BLUE SHIELD | Admitting: Internal Medicine

## 2015-07-11 VITALS — BP 126/78 | HR 78 | Ht 67.0 in | Wt 146.8 lb

## 2015-07-11 DIAGNOSIS — J4550 Severe persistent asthma, uncomplicated: Secondary | ICD-10-CM | POA: Insufficient documentation

## 2015-07-11 DIAGNOSIS — D721 Eosinophilia, unspecified: Secondary | ICD-10-CM

## 2015-07-11 DIAGNOSIS — B4481 Allergic bronchopulmonary aspergillosis: Secondary | ICD-10-CM | POA: Diagnosis not present

## 2015-07-11 LAB — NITRIC OXIDE: Nitric Oxide: 28

## 2015-07-11 MED ORDER — TIOTROPIUM BROMIDE MONOHYDRATE 2.5 MCG/ACT IN AERS: 2.0000 | INHALATION_SPRAY | Freq: Every day | RESPIRATORY_TRACT | Status: DC

## 2015-07-11 NOTE — Patient Instructions (Addendum)
ICD-9-CM ICD-10-CM   1. Severe persistent asthma, uncomplicated 123456 [id number]    -  - Need to talk to Dr Orvil Feil to see if you have ABPA and also discuss injectable therapy either Xolair or Nucala - my preference being the latter - take product sheet - Continue Dulera 2 puff 2 times daily -Continue Singulair daily - Continue daily prednisone -Start Spiriva daily respimat  Follow-up - Please await a phone call by end of the week if not please call us back -

## 2015-07-11 NOTE — Progress Notes (Signed)
Subjective:     Patient ID: [name], female   DOB: 1961/04/20, 55 y.o.   MRN: [id number]  HPI   PCP Vidal Schwalbe, MD Allergist - Dr Orvil Feil ENT - Dr Cy Blamer March 2015 with Dr Joya Gaskins /13/2015 Chief Complaint  Patient presents with  . 1 month follow up    Cough has improved - prod now with green mucus.  C/o constant dull pain under left breast x 3 wks; pain is worse and changes to stabing pain with cough or when inhaling deeply.  Cough is better.  Not as much wheezing.  Pt had strep throat, EGD was normal, esophagus neg for GERD.   Three weeks ago severe pain L chest area.  Dull ache if deep breath hurts.   Pt still using symbicort prn.   No real post nasal drip.  rx more abx for strep throat.   email from [place] 06/22/15 I'm sending a patient to you named [name] DOB [birth year]. She was evaluated by Dr Joya Gaskins in the past.  She has had recurrent dyspneic episodes over the past few years which have worsened over the past few months despite ICS/Laba treatment. Her aeroallergen testing has been completely negative. Today I saw her for an episode. She was actively wheezing with a pulse ox of 93%. I did not perform one today but a FENO done in March 2015 resulted as 91 ppb. She follows with Dr Wilburn Cornelia for sinus disease and is s/p ENSS. Today I sent an immune work up, IgE, ANCAs and ABPA labs. She has had a clear CT chest in the past. I will send you all documentation. I'm asking you to see her as she is not atopic and am not sure what else could be causing such frequent and severe episodes.   Thank you in advance for seeing her. Hope to see you soon.   Sincerely, Meg   email from Dr Remus Blake on Jun 30, 2015 12:50 PM --- Thanks, [name]. She has an appt with you on 2/14. Some of her labs have resulted. She has an elevated Eosinophil count of 3,000 and a modestly elevated IgE of 224. Her Aspergillus titers are pending. Highly suspicious for ABPA (ANCAs and ESR were normal) or  Churgg Strauss. Will order additional blood work if they come back negative to work up the eosinophilia. If APBA will start on Pred pending her apppointment with you. I'm out next week but have signed everything out to [place]. Will fax you all blood work when resulted.  Thank you! Meg   OV 07/11/2015 Chief Complaint  Patient presents with  . Follow-up    Former PW pt. Pt states her breathing is doing well. Pt c/o mild dry cough. Pt denies CP/tightness. Pt states she has an upcoming CT sinus.    This is a follow-up for chronic cough and asthma symptoms. Used to see Dr. Asencion Noble over 2 years ago. Now referred by Dr. Remus Blake allergist for complicated asthma and suspicion of ABPA.  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 Reports she is a nonsmoker. Then some 3-1/2 years ago 1 day woke up and started having shortness of breath with nasal congestion and since then she's been on well. This resulted in ENT evaluation and allergy evaluation. She says that she's had 2 sinus surgeries and has had multiple asthma exacerbations. She recollects at least 12 prednisone courses in the last 2 years - -3 years with the longest one  being 30 days. She thinks that a total of 70 days in the last 2 years she's been on prednisone. There is on treatments always associated with anabiotic's. She's been to the emergency room or urgent care at least 6 times in the last 2 years but no hospitalizations. Symptoms are typically improved when she travels outside of [place]. Symptoms are mainly chest tightness, wheezing, dyspnea, significant sinus congestion. Her maintenance treatment for the last 3 years has been [place] and Singulair daily associated with pro-air.   In phone call with Dr Orvil Feil 07/12/2015 - she told me that patient only had dx of asthma with negative skin test including aspergillus few years ago. Her decline has been only in last 6 months   Relevant investigations as ascertain  from her and review of the charts -2 years ago skin test for allergies was negative at Dr. Lilli Few office including aspergillus skin test 2014 CT sinus pansinusitis and since then has had sinus surgery 2 with one being in 2014 and 07/26/2013. She has upcoming sinus CT with Dr. Wilburn Cornelia 2015 upper endoscopy with Dr. Henrene Pastor apparently normal per history -2016 August had acute dyspnea and had CT angiogram chest which ruled out pulmonary embolism but showed normal lung parenchyma. Personally visualized. -9622 March cyclical cough treatment with Dr. Asencion Noble temporarily helped. - Most recently it appears Dr. Remus Blake per her email as stated above and based on patient's complex persistent history started looking for asthma associated disease states or complications. 06/22/2015 patient was in severe exacerbation with a high exhaled nitric oxide of 91. Patient was treated with prednisone and antibiotics. On the same day she had blood work that showed eosinophils of 3000 absolute along with a slightly elevated IgE of 224 (done 30 min after a steroid shot and ? > 5d after last dose of pre) but rest of the immunoglobulin profile was normal. Patient also had MPO and PR 3 antibodies for vasculitis and this was normal. I personally visualize these results. Patient had a sedimentation rate of 6. She was tested for diphtheria antibody and tetanus toxin and these appear normal. 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 Fredderick Phenix on 07/03/2015 as follow-up and she was feeling better. She recollects having had a skin test for Aspergillus fumigatus. 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 The outside notes confirm this. In the interim the prednisone helped the eosinophilia down to 600 cells per cubic millimeter. She was started on Cardizem 20 mg per day and then referred here.  Today  in the office she feels well on daily prednisone. Asthma control questionnaire shows an average a CQ of 0.8 showing good control. At night she is hardly ever waking up because of asthma symptoms. When she wakes up in the morning she has very mild symptoms. Her activities are not limited by asthma. In the daytime is a very little shortness of breath. She is wheezing only hardly. She's not using albuterol for rescue. All of this is in the past one week.    Asthma Control Panel 06/22/15 Dr Remus Blake office 07/11/2015   Current Med Regimen dulera + singulair pred daily since 1/26 or 07/03/15 + dulera + singulair  ACQ 5 point- 1 week. wtd avg score. <1.0 is good control 0.75-1.25 is grey zone. >1.25 poor control. Delta 0.5 is clinically meaningful In acute attack 0.8  ACQ 7 point - 1 week.  wtd avg score. <1.0 is good control 0.75-1.25 is grey zone. >1.25 poor control. Delta 0.5 is clinically meaningful  x  ACT - a [employer] test - 4 week. Total score. Max is 25, Lower score is worse.  <19 = poor control  x  FeNO ppB 91 (blood eos 3000,IgE 224) - labs done 25mn after depot medrol shot..  28  FeV1     Planned intervention  for visit apergillus ppnt IgG - positive and Intra-dermal skin test for fumigatus positive - Dr WOrvil Feil add Spiriva. Plus daily prednisone to continue plus the letter to continue plus single her to continue. Consider Nucala      has a past medical history of Bunion; Hammer toe; Arthritis; Hypothyroidism; Asthma; and GERD (gastroesophageal reflux disease).   reports that she has never smoked. She has never used smokeless tobacco.  Past Surgical History  Procedure Laterality Date  . Colonoscopy    . Polypectomy    . Cesarean section      x2  . Total knee arthroplasty Right   . Neck surgery      plates and screws in  . Rotator cuff repair Right     right  . Bunionectomy with hammertoe reconstruction Bilateral   . Nasal sinus surgery    . Great toe arthrodesis, interphalangeal joint       No Known Allergies   There is no immunization history on file for this patient.  Family History  Problem Relation Age of Onset  . Colon cancer Neg Hx   . Esophageal cancer Neg Hx   . Stomach cancer Neg Hx   . Rectal cancer Neg Hx   . Emphysema Mother   . Melanoma Father   . Heart disease Maternal Uncle   . Melanoma Paternal Uncle      Current outpatient prescriptions:  .  albuterol (PROVENTIL) (2.5 MG/3ML) 0.083% nebulizer solution, Take 2.5 mg by nebulization every 6 (six) hours as needed for wheezing or shortness of breath., Disp: , Rfl:  .  fluticasone (FLONASE) 50 MCG/ACT nasal spray, Place 2 sprays into the nose daily., Disp: 16 g, Rfl: 2 .  levothyroxine (SYNTHROID, LEVOTHROID) 137 MCG tablet, Take 137 mcg by mouth daily before breakfast. , Disp: , Rfl:  .  mometasone-formoterol (DULERA) 100-5 MCG/ACT AERO, Inhale 2 puffs into the lungs 2 (two) times daily., Disp: , Rfl:  .  montelukast (SINGULAIR) 10 MG tablet, Take 10 mg by mouth at bedtime., Disp: , Rfl:  .  omeprazole (PRILOSEC) 20 MG capsule, Take 20 mg by mouth daily., Disp: , Rfl:  .  predniSONE (DELTASONE) 50 MG tablet, Take 1 tablet (50 mg total) by mouth daily. (Patient taking differently: Take 20 mg by mouth daily. ), Disp: 5 tablet, Rfl: 0 .  PROAIR HFA 108 (90 BASE) MCG/ACT inhaler, Inhale 2 puffs into the lungs every 6 (six) hours as needed for wheezing or shortness of breath. , Disp: , Rfl:  .  sodium chloride (OCEAN) 0.65 % SOLN nasal spray, Place 1 spray into both nostrils as needed for congestion. Nasal spray or Neti-pot, Disp: , Rfl:  .  valACYclovir (VALTREX) 1000 MG tablet, as needed., Disp: , Rfl:      Review of Systems     Objective:   Physical Exam  Constitutional: She is oriented to person, place, and time. She appears well-developed and well-nourished. No distress.  HENT:  Head: Normocephalic and atraumatic.  Right Ear: External ear normal.  Left Ear: External ear normal.  Mouth/Throat:  Oropharynx is clear and moist. No oropharyngeal exudate.  Nasal twang  Eyes: Conjunctivae and EOM are normal. Pupils are equal, round, and reactive to light. Right eye exhibits no discharge. Left eye exhibits no discharge. No scleral icterus.  Neck: Normal range of motion. Neck supple. No JVD present. No tracheal deviation present. No thyromegaly present.  Cardiovascular: Normal rate, regular rhythm, normal heart sounds and intact distal pulses.  Exam reveals no gallop and no friction rub.   No murmur heard. Pulmonary/Chest: Effort normal and breath sounds normal. No respiratory distress. She has no wheezes. She has no rales. She exhibits no tenderness.  Abdominal: Soft. Bowel sounds are normal. She exhibits no distension and no mass. There is no tenderness. There is no rebound and no guarding.  Musculoskeletal: Normal range of motion. She exhibits no edema or tenderness.  Lymphadenopathy:    She has no cervical adenopathy.  Neurological: She is alert and oriented to person, place, and time. She has normal reflexes. No cranial nerve deficit. She exhibits normal muscle tone. Coordination normal.  Skin: Skin is warm and dry. No rash noted. She is not diaphoretic. No erythema. No pallor.  Psychiatric: She has a normal mood and affect. Her behavior is normal. Judgment and thought content normal.  Vitals reviewed.   Filed Vitals:   07/11/15 1518  BP: 126/78  Pulse: 78  Height: _0  (1.702 m)  Weight: 146 lb 12.8 oz (66.588 kg)  SpO2: 96%        Assessment:       ICD-9-CM ICD-10-CM   1. Severe persistent asthma, uncomplicated 235.36 R44.31 Nitric oxide  2. 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 ABPA (allergic bronchopulmonary aspergillosis) ([place]) 518.6 B44.81     Houston Siren meets several "new proposed" diagnostic criteria for serological-ABPA. In past bronchiectasis was  A needed feature for diagnosis of ABPA. She does not have it. Under proposed 2013 criteria - bronchiectasis is considered  a complication of ABPA. Under these criteria with background hx of asthma + skin test mmediate Hsty to ASpergillys + IgG positive for aspergillus fumigatus (per call with Dr Orvil Feil) and Eos > 500 she has serological-ABPA and is at risk for future bronchiectasis.  However, her IgE is < 1000 (was in 300s) and this is still considered an obligatory criteria and I wonder if repeated prednisone has masked this. and her ASprgillus IgE was negative. Not sure why.  Nevertheles she has many features to support serological-ABPA. 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 I have told her tht this is mainstay of Rx. She wants to try this approach first. She is aware of all risks of chronic steroid Rx and wants to taper down to < 34m per day and see how she does. I will add spiriva to the mix to see if this helps at all.   At fu will check cbc with diff, IgE, spirometry and FeNO - 6 weeks or so   Based on above, will discuss steroid sparing itraconazole Rx and its side effects. This will be an upstream Rx to disrupt aspergillus sensization,  Alternative, will be to target downstream tissue destroying peripheral target of eosinphilia with Mepoluzimab (Nucala - GSK) targeting the IL-5 pathway. Injection Q4 weeks. Not been studied in ABPA but biologically do  not seee why it should not help her esp if her Th2/eos burden is so  Hight  She is agreeable to this plan    > 50% of this > 40 min visit spent in face to face counseling or/and coordination of care    Dr. Brand Males, M.D., [facility]C.P Pulmonary and Critical Care Medicine Staff Physician [place] [facility] Pager: [phone], If no answer or between  15:00h - 7:00h: call [phone]  07/13/2015 6:07 AM

## 2015-07-12 ENCOUNTER — Telehealth: Payer: Self-pay | Admitting: Internal Medicine

## 2015-07-12 DIAGNOSIS — D721 Eosinophilia, unspecified: Secondary | ICD-10-CM | POA: Insufficient documentation

## 2015-07-12 DIAGNOSIS — B4481 Allergic bronchopulmonary aspergillosis: Secondary | ICD-10-CM | POA: Insufficient documentation

## 2015-07-12 NOTE — Telephone Encounter (Signed)
lmtcb for pt.  

## 2015-07-12 NOTE — Telephone Encounter (Addendum)
Spoke to ms [place] Siren just now. Line was bad  Pls tell her  - give appt to see me back in 4-8 weeks - will need spiroimetry at fu, ACQ and feno at fu. Also do blood cbc with diff and IgE  1-3 days before seeing me  - alsk o ask her if she exposure to far, gardenng, soil, building renovation, musty environment (cellars, old books, lofts, archives)   - at that time will discuss Nucala v Azole  - any flare up in between to call us or come sooner  Dr. Brand Males, M.D., F.C.C.P Pulmonary and Critical Care Medicine Staff Physician [facility] Pager: [phone], If no answer or between  15:00h - 7:00h: call [phone]  07/12/2015 1:53 PM

## 2015-07-13 NOTE — Telephone Encounter (Signed)
lmtcb for pt.  

## 2015-07-19 NOTE — Telephone Encounter (Signed)
lmtcb for pt.  

## 2015-07-21 ENCOUNTER — Encounter: Payer: Self-pay | Admitting: Emergency Medicine

## 2015-07-21 NOTE — Telephone Encounter (Signed)
lmtcb for pt x 4. Due to several unsuccessful attempt to reach pt, a letter has been sent to pt to contact us.   Will send to MR as FYI.

## 2015-07-22 NOTE — Telephone Encounter (Signed)
What number did you call? Because I was able to reach her. Make sure is not the dpoa you called but you called her. If you called the right number then make sure you have tried her contact

## 2015-07-24 ENCOUNTER — Telehealth: Payer: Self-pay | Admitting: Internal Medicine

## 2015-07-24 NOTE — Telephone Encounter (Signed)
lmtcb X1 for pt  

## 2015-07-24 NOTE — Telephone Encounter (Signed)
[phone], pt cb

## 2015-07-24 NOTE — Telephone Encounter (Signed)
Per the 2.15.17 phone note, Daneil Dan had been trying to contact patient without success...  Brand Males, MD at 07/12/2015  1:52 PM       Status: Addendum        Expand All Collapse All   Spoke to ms [place] Siren just now. Line was bad  Pls tell her  - give appt to see me back in 4-8 weeks - will need spiroimetry at fu, ACQ and feno at fu. Also do blood cbc with diff and IgE  1-3 days before seeing me  - alsk o ask her if she exposure to far, gardenng, soil, building renovation, musty environment (cellars, old books, lofts, archives)   - at that time will discuss Nucala v Azole  - any flare up in between to call us or come sooner  Dr. Brand Males, M.D., F.C.C.P Pulmonary and Critical Care Medicine Staff Physician [facility] Pager: [phone], If no answer or between  15:00h - 7:00h: call [phone]  07/12/2015 1:53 PM       LMOM TCB x1

## 2015-07-25 NOTE — Telephone Encounter (Signed)
lmtcb X2 for pt.  

## 2015-07-25 NOTE — Telephone Encounter (Signed)
[phone], pt cb

## 2015-07-25 NOTE — Telephone Encounter (Signed)
Spoke with pt, already scheduled for 3/29 with MR.  Nothing further needed.

## 2015-07-26 NOTE — Telephone Encounter (Signed)
I called the only number on file for her, [phone] and reached her personal VM each time.

## 2015-07-26 NOTE — Telephone Encounter (Signed)
Strange. Do not know if she is busy or not happy with [place] or what. Yeah send letter. I will be int ouch with Dr Remus Blake. No need to reply back

## 2015-08-23 ENCOUNTER — Ambulatory Visit: Payer: BLUE CROSS/BLUE SHIELD | Admitting: Internal Medicine

## 2015-08-30 ENCOUNTER — Encounter: Payer: Self-pay | Admitting: Internal Medicine

## 2015-08-30 ENCOUNTER — Ambulatory Visit (INDEPENDENT_AMBULATORY_CARE_PROVIDER_SITE_OTHER): Payer: BLUE CROSS/BLUE SHIELD | Admitting: Internal Medicine

## 2015-08-30 VITALS — BP 122/82 | HR 74 | Ht 67.0 in | Wt 148.0 lb

## 2015-08-30 DIAGNOSIS — J4550 Severe persistent asthma, uncomplicated: Secondary | ICD-10-CM

## 2015-08-30 DIAGNOSIS — J018 Other acute sinusitis: Secondary | ICD-10-CM | POA: Diagnosis not present

## 2015-08-30 DIAGNOSIS — D721 Eosinophilia, unspecified: Secondary | ICD-10-CM

## 2015-08-30 LAB — NITRIC OXIDE: NITRIC OXIDE: 37

## 2015-08-30 MED ORDER — LEVOFLOXACIN 500 MG PO TABS: 500.0000 mg | ORAL_TABLET | Freq: Every day | ORAL | Status: DC

## 2015-08-30 NOTE — Patient Instructions (Addendum)
ICD-9-CM ICD-10-CM   1. Severe persistent asthma, uncomplicated 123456 [id number]   2. 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

## 2015-08-30 NOTE — Progress Notes (Signed)
Subjective:     Patient ID: [name], female   DOB: Feb 22, 1961, 55 y.o.   MRN: [id number]  HPI   PCP Vidal Schwalbe, MD Allergist - Dr Orvil Feil ENT - Dr Cy Blamer March 2015 with Dr Joya Gaskins /13/2015 Chief Complaint  Patient presents with  . 1 month follow up    Cough has improved - prod now with green mucus.  C/o constant dull pain under left breast x 3 wks; pain is worse and changes to stabing pain with cough or when inhaling deeply.  Cough is better.  Not as much wheezing.  Pt had strep throat, EGD was normal, esophagus neg for GERD.   Three weeks ago severe pain L chest area.  Dull ache if deep breath hurts.   Pt still using symbicort prn.   No real post nasal drip.  rx more abx for strep throat.   email from [place] 06/22/15 I'm sending a patient to you named [name] DOB [birth year]. She was evaluated by Dr Joya Gaskins in the past.  She has had recurrent dyspneic episodes over the past few years which have worsened over the past few months despite ICS/Laba treatment. Her aeroallergen testing has been completely negative. Today I saw her for an episode. She was actively wheezing with a pulse ox of 93%. I did not perform one today but a FENO done in March 2015 resulted as 91 ppb. She follows with Dr Wilburn Cornelia for sinus disease and is s/p ENSS. Today I sent an immune work up, IgE, ANCAs and ABPA labs. She has had a clear CT chest in the past. I will send you all documentation. I'm asking you to see her as she is not atopic and am not sure what else could be causing such frequent and severe episodes.   Thank you in advance for seeing her. Hope to see you soon.   Sincerely, Meg   email from Dr Remus Blake on Jun 30, 2015 12:50 PM --- Thanks, [name]. She has an appt with you on 2/14. Some of her labs have resulted. She has an elevated Eosinophil count of 3,000 and a modestly elevated IgE of 224. Her Aspergillus titers are pending. Highly suspicious for ABPA (ANCAs and ESR were normal) or  Churgg Strauss. Will order additional blood work if they come back negative to work up the eosinophilia. If APBA will start on Pred pending her apppointment with you. I'm out next week but have signed everything out to [name]. Will fax you all blood work when resulted.  Thank you! Meg   OV 07/11/2015 Chief Complaint  Patient presents with  . Follow-up    Former PW pt. Pt states her breathing is doing well. Pt c/o mild dry cough. Pt denies CP/tightness. Pt states she has an upcoming CT sinus.    This is a follow-up for chronic cough and asthma symptoms. Used to see Dr. Asencion Noble over 2 years ago. Now referred by Dr. Remus Blake allergist for complicated asthma and suspicion of ABPA.  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 Reports she is a nonsmoker. Then some 3-1/2 years ago 1 day woke up and started having shortness of breath with nasal congestion and since then she's been on well. This resulted in ENT evaluation and allergy evaluation. She says that she's had 2 sinus surgeries and has had multiple asthma exacerbations. She recollects at least 12 prednisone courses in the last 2 years - -3 years with the longest one  being 30 days. She thinks that a total of 70 days in the last 2 years she's been on prednisone. There is on treatments always associated with anabiotic's. She's been to the emergency room or urgent care at least 6 times in the last 2 years but no hospitalizations. Symptoms are typically improved when she travels outside of [place]. Symptoms are mainly chest tightness, wheezing, dyspnea, significant sinus congestion. Her maintenance treatment for the last 3 years has been [place] and Singulair daily associated with pro-air.   In phone call with Dr Orvil Feil 07/12/2015 - she told me that patient only had dx of asthma with negative skin test including aspergillus few years ago. Her decline has been only in last 6 months   Relevant investigations as ascertain  from her and review of the charts -2 years ago skin test for allergies was negative at Dr. Lilli Few office including aspergillus skin test 2014 CT sinus pansinusitis and since then has had sinus surgery 2 with one being in 2014 and 07/26/2013. She has upcoming sinus CT with Dr. Wilburn Cornelia 2015 upper endoscopy with Dr. Henrene Pastor apparently normal per history -2016 August had acute dyspnea and had CT angiogram chest which ruled out pulmonary embolism but showed normal lung parenchyma. Personally visualized. -0277 March cyclical cough treatment with Dr. Asencion Noble temporarily helped. - Most recently it appears Dr. Remus Blake per her email as stated above and based on patient's complex persistent history started looking for asthma associated disease states or complications. 06/22/2015 patient was in severe exacerbation with a high exhaled nitric oxide of 91. Patient was treated with prednisone and antibiotics. On the same day she had blood work that showed eosinophils of 3000 absolute along with a slightly elevated IgE of 224 (done 30 min after a steroid shot and ? > 5d after last dose of pre) but rest of the immunoglobulin profile was normal. Patient also had MPO and PR 3 antibodies for vasculitis and this was normal. I personally visualize these results. Patient had a sedimentation rate of 6. She was tested for diphtheria antibody and tetanus toxin and these appear normal. 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 Fredderick Phenix on 07/03/2015 as follow-up and she was feeling better. She recollects having had a skin test for Aspergillus fumigatus. 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 The outside notes confirm this. In the interim the prednisone helped the eosinophilia down to 600 cells per cubic millimeter. She was started on Cardizem 20 mg per day and then referred here.  Today  in the office she feels well on daily prednisone. Asthma control questionnaire shows an average a CQ of 0.8 showing good control. At night she is hardly ever waking up because of asthma symptoms. When she wakes up in the morning she has very mild symptoms. Her activities are not limited by asthma. In the daytime is a very little shortness of breath. She is wheezing only hardly. She's not using albuterol for rescue. All of this is in the past one week.   OV 08/30/2015  Chief Complaint  Patient presents with  . Follow-up    Pt states her breathing has worsened over the last 10 days. Pt states they found mold in her home. Pt c/o sinus congestion with green mucus and mild non prod cough.    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 She presents for follow-up. Today she tells me that they found mold in the ventilation ducts at home. Then apparently cleaning it and she is having a new ventilation system placed. She believes that this might be the cause of recent deterioration. Last 1 week she's having increased sinus congestion with green sinus drainage. She's having significant postnasal drip. Asthma symptoms are stable but she is worried that she is descending into asthma exacerbation. She is compliant with Spiriva, Dulera, prednisone and Singulair. Despite this she still has symptoms. She is open to the idea of add on therapy.    5. asthma questionnaire shows deterioration in symptoms but she attributes this to increased sinus drainage and not asthma exacerbation itself. She is waking up many times at night because of runny nose . When she wakes of asthma symptoms are mild. She is only very slightly limited in her activities because of asthma. She is a little short of breath because of asthma and she is wheezing a little of the time. She is using albuterol for rescue 1-2 times daily.  However exhaled nitric oxide is 37 ppb  and in the gray zone and much better than before   Asthma Control Panel 06/22/15 Dr Remus Blake office 07/11/2015  08/30/2015   Current Med Regimen dulera + singulair pred daily since 1/26 or 07/03/15 + dulera + singulair Spiriva, dulera, p[rednisone, singulair  ACQ 5 point- 1 week. wtd avg score. <1.0 is good control 0.75-1.25 is grey zone. >1.25 poor control. Delta 0.5 is clinically meaningful In acute attack 0.8 2.1 - mostly due t osinus  ACQ 7 point - 1 week. wtd avg score. <1.0 is good control 0.75-1.25 is grey zone. >1.25 poor control. Delta 0.5 is clinically meaningful  x   ACT - a [employer] test - 4 week. Total score. Max is 25, Lower score is worse.  <19 = poor control  x   FeNO ppB 91 (blood eos 3000,IgE 224) - labs done 38mn after depot medrol shot..  28 37  FeV1      Planned intervention  for visit apergillus ppnt IgG - positive and Intra-dermal skin test for fumigatus positive - Dr WOrvil Feil add Spiriva. Plus daily prednisone to continue plus singulair.  Consider Nucala      Current outpatient prescriptions:  .  albuterol (PROVENTIL) (2.5 MG/3ML) 0.083% nebulizer solution, Take 2.5 mg by nebulization every 6 (six) hours as needed for wheezing or shortness of breath., Disp: , Rfl:  .  fluticasone (FLONASE) 50 MCG/ACT nasal spray, Place 2 sprays into the nose daily., Disp: 16 g, Rfl: 2 .  levothyroxine (SYNTHROID, LEVOTHROID) 137 MCG tablet, Take 137 mcg by mouth daily before breakfast. , Disp: , Rfl:  .  mometasone-formoterol (DULERA) 100-5 MCG/ACT AERO, Inhale 2 puffs into the lungs 2 (two) times daily., Disp: , Rfl:  .  montelukast (SINGULAIR) 10 MG tablet, Take 10 mg by mouth at bedtime., Disp: , Rfl:  .  omeprazole (PRILOSEC) 20 MG capsule, Take 20 mg by mouth daily., Disp: , Rfl:  .  predniSONE (DELTASONE) 50 MG tablet, Take 1 tablet (50 mg total) by mouth daily. (Patient taking differently: Take 20 mg by mouth daily. ), Disp: 5 tablet, Rfl: 0 .  PROAIR HFA 108 (90 BASE) MCG/ACT inhaler,  Inhale 2 puffs into the lungs every 6 (six) hours as needed for wheezing or shortness of breath. , Disp: , Rfl:  .  sodium chloride (OCEAN) 0.65 % SOLN nasal spray, Place 1  spray into both nostrils as needed for congestion. Nasal spray or Neti-pot, Disp: , Rfl:  .  Tiotropium Bromide Monohydrate (SPIRIVA RESPIMAT) 2.5 MCG/ACT AERS, Inhale 2 puffs into the lungs daily., Disp: 1 Inhaler, Rfl: 11 .  valACYclovir (VALTREX) 1000 MG tablet, as needed., Disp: , Rfl:   No Known Allergies    Review of Systems     Objective:   Physical Exam Filed Vitals:   08/30/15 1330  BP: 122/82  Pulse: 74  Height: 5' 7"  (1.702 m)  Weight: 148 lb (67.132 kg)  SpO2: 96%   General exam: Looks well pleasant  HEENT: Postnasal drip present nasal twang conversation Respiratory exam: Clear to auscultation bilaterally no wheezes no distress Cardiovascular: Normal heart sounds regular rate and rhythm Abdomen: Soft Extremities: No cyanosis no clubbing no edema Neurologic: Alert and oriented 3. Speech normal     Assessment:       ICD-9-CM ICD-10-CM   1. Severe persistent asthma, uncomplicated 202.54 Y70.62   2. 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 Other acute sinusitis 461.8 J01.80        Plan:      Currently acute sinusitis but no evidence of asthma flare up but  You are at risk  Plan -  take levaquin 560m once daily  X 7 days - continue spiriva, dulera, singulair an ddaily prednisone - if you get worse, [place] pred burst (we'll hold off prednisone burst now because of fairly normal exhaled nitric oxide] - START PAPER WORK FOR NUCALA - start it  - take product info sheet - And we discussed the side effects - reduce mold exposure at home   Followup  6 weeks or sooner if needed   > 50% of this > 25 min visit spent in face to face counseling or coordination of care     Dr. MBrand Males M.D., FCalvary HospitalC.P Pulmonary and Critical Care Medicine Staff Physician [facility] Pager: [phone] If no answer or between  15:00h - 7:00h: call [phone]  08/30/2015 2:00 PM

## 2015-08-30 NOTE — Addendum Note (Signed)
Addended by: Collier Salina on: 08/30/2015 04:56 PM   Modules accepted: Orders

## 2015-10-10 ENCOUNTER — Telehealth: Payer: Self-pay | Admitting: Internal Medicine

## 2015-10-10 DIAGNOSIS — M25562 Pain in left knee: Secondary | ICD-10-CM | POA: Diagnosis not present

## 2015-10-11 ENCOUNTER — Other Ambulatory Visit: Payer: Self-pay | Admitting: Orthopedic Surgery

## 2015-10-11 DIAGNOSIS — Z0000 Encounter for general adult medical examination without abnormal findings: Secondary | ICD-10-CM | POA: Diagnosis not present

## 2015-10-11 DIAGNOSIS — Z23 Encounter for immunization: Secondary | ICD-10-CM | POA: Diagnosis not present

## 2015-10-11 DIAGNOSIS — Z13220 Encounter for screening for lipoid disorders: Secondary | ICD-10-CM | POA: Diagnosis not present

## 2015-10-11 DIAGNOSIS — M25562 Pain in left knee: Secondary | ICD-10-CM

## 2015-10-11 DIAGNOSIS — D721 Eosinophilia: Secondary | ICD-10-CM | POA: Diagnosis not present

## 2015-10-11 DIAGNOSIS — E039 Hypothyroidism, unspecified: Secondary | ICD-10-CM | POA: Diagnosis not present

## 2015-10-11 DIAGNOSIS — R252 Cramp and spasm: Secondary | ICD-10-CM | POA: Diagnosis not present

## 2015-10-11 DIAGNOSIS — Z209 Contact with and (suspected) exposure to unspecified communicable disease: Secondary | ICD-10-CM | POA: Diagnosis not present

## 2015-10-11 NOTE — Telephone Encounter (Signed)
Nucala benefits were discussed; I will reach out to patient and document in -outbound-phone note.

## 2015-10-12 ENCOUNTER — Ambulatory Visit
Admission: RE | Admit: 2015-10-12 | Discharge: 2015-10-12 | Disposition: A | Discharge status: Home / Self Care | Payer: BLUE CROSS/BLUE SHIELD | Source: Ambulatory Visit | Attending: Orthopedic Surgery | Admitting: Orthopedic Surgery

## 2015-10-12 DIAGNOSIS — M25562 Pain in left knee: Secondary | ICD-10-CM

## 2015-10-12 DIAGNOSIS — R2242 Localized swelling, mass and lump, left lower limb: Secondary | ICD-10-CM | POA: Diagnosis not present

## 2015-10-17 ENCOUNTER — Telehealth: Payer: Self-pay | Admitting: Internal Medicine

## 2015-10-17 DIAGNOSIS — M25562 Pain in left knee: Secondary | ICD-10-CM | POA: Diagnosis not present

## 2015-10-17 NOTE — Telephone Encounter (Signed)
Spoke with [facility] [place] wanted to inform me that they triaged the Rx for Nucala to [facility] and that pharmacy would be reaching out to the patient. Nothing more needed at this time.

## 2015-10-19 ENCOUNTER — Ambulatory Visit (INDEPENDENT_AMBULATORY_CARE_PROVIDER_SITE_OTHER): Payer: BLUE CROSS/BLUE SHIELD | Admitting: Internal Medicine

## 2015-10-19 ENCOUNTER — Encounter: Payer: Self-pay | Admitting: Internal Medicine

## 2015-10-19 VITALS — BP 124/78 | HR 81 | Ht 67.0 in | Wt 149.0 lb

## 2015-10-19 DIAGNOSIS — J4550 Severe persistent asthma, uncomplicated: Secondary | ICD-10-CM

## 2015-10-19 LAB — NITRIC OXIDE: Nitric Oxide: 49

## 2015-10-19 NOTE — Progress Notes (Signed)
Subjective:     Patient ID: [name], female   DOB: [birth year], 55 y.o.   MRN: [id number]  HPI  PCP Vidal Schwalbe, MD Allergist - Dr Orvil Feil ENT - Dr Cy Blamer March 2015 with Dr Joya Gaskins /13/2015 Chief Complaint  Patient presents with  . 1 month follow up    Cough has improved - prod now with green mucus.  C/o constant dull pain under left breast x 3 wks; pain is worse and changes to stabing pain with cough or when inhaling deeply.  Cough is better.  Not as much wheezing.  Pt had strep throat, EGD was normal, esophagus neg for GERD.   Three weeks ago severe pain L chest area.  Dull ache if deep breath hurts.   Pt still using symbicort prn.   No real post nasal drip.  rx more abx for strep throat.   email from [place] 06/22/15 I'm sending a patient to you named [name] DOB [birth year]. She was evaluated by Dr Joya Gaskins in the past.  She has had recurrent dyspneic episodes over the past few years which have worsened over the past few months despite ICS/Laba treatment. Her aeroallergen testing has been completely negative. Today I saw her for an episode. She was actively wheezing with a pulse ox of 93%. I did not perform one today but a FENO done in March 2015 resulted as 91 ppb. She follows with Dr Wilburn Cornelia for sinus disease and is s/p ENSS. Today I sent an immune work up, IgE, ANCAs and ABPA labs. She has had a clear CT chest in the past. I will send you all documentation. I'm asking you to see her as she is not atopic and am not sure what else could be causing such frequent and severe episodes.   Thank you in advance for seeing her. Hope to see you soon.   Sincerely, Meg   email from Dr Remus Blake on Jun 30, 2015 12:50 PM --- Thanks, [name]. She has an appt with you on 2/14. Some of her labs have resulted. She has an elevated Eosinophil count of 3,000 and a modestly elevated IgE of 224. Her Aspergillus titers are pending. Highly suspicious for ABPA (ANCAs and ESR were normal) or  Churgg Strauss. Will order additional blood work if they come back negative to work up the eosinophilia. If APBA will start on Pred pending her apppointment with you. I'm out next week but have signed everything out to [place]. Will fax you all blood work when resulted.  Thank you! Meg   OV 07/11/2015 Chief Complaint  Patient presents with  . Follow-up    Former PW pt. Pt states her breathing is doing well. Pt c/o mild dry cough. Pt denies CP/tightness. Pt states she has an upcoming CT sinus.    This is a follow-up for chronic cough and asthma symptoms. Used to see Dr. Asencion Noble over 2 years ago. Now referred by Dr. Remus Blake allergist for complicated asthma and suspicion of ABPA.  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 Reports she is a nonsmoker. Then some 3-1/2 years ago 1 day woke up and started having shortness of breath with nasal congestion and since then she's been on well. This resulted in ENT evaluation and allergy evaluation. She says that she's had 2 sinus surgeries and has had multiple asthma exacerbations. She recollects at least 12 prednisone courses in the last 2 years - -3 years with the longest one being  30 days. She thinks that a total of 70 days in the last 2 years she's been on prednisone. There is on treatments always associated with anabiotic's. She's been to the emergency room or urgent care at least 6 times in the last 2 years but no hospitalizations. Symptoms are typically improved when she travels outside of [place]. Symptoms are mainly chest tightness, wheezing, dyspnea, significant sinus congestion. Her maintenance treatment for the last 3 years has been [place] and Singulair daily associated with pro-air.   In phone call with Dr Orvil Feil 07/12/2015 - she told me that patient only had dx of asthma with negative skin test including aspergillus few years ago. Her decline has been only in last 6 months   Relevant investigations as ascertain  from her and review of the charts -2 years ago skin test for allergies was negative at Dr. Lilli Few office including aspergillus skin test 2014 CT sinus pansinusitis and since then has had sinus surgery 2 with one being in 2014 and 07/26/2013. She has upcoming sinus CT with Dr. Wilburn Cornelia 2015 upper endoscopy with Dr. Henrene Pastor apparently normal per history -2016 August had acute dyspnea and had CT angiogram chest which ruled out pulmonary embolism but showed normal lung parenchyma. Personally visualized. -5859 March cyclical cough treatment with Dr. Asencion Noble temporarily helped. - Most recently it appears Dr. Remus Blake per her email as stated above and based on patient's complex persistent history started looking for asthma associated disease states or complications. 06/22/2015 patient was in severe exacerbation with a high exhaled nitric oxide of 91. Patient was treated with prednisone and antibiotics. On the same day she had blood work that showed eosinophils of 3000 absolute along with a slightly elevated IgE of 224 (done 30 min after a steroid shot and ? > 5d after last dose of pre) but rest of the immunoglobulin profile was normal. Patient also had MPO and PR 3 antibodies for vasculitis and this was normal. I personally visualize these results. Patient had a sedimentation rate of 6. She was tested for diphtheria antibody and tetanus toxin and these appear normal. 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 Fredderick Phenix on 07/03/2015 as follow-up and she was feeling better. She recollects having had a skin test for Aspergillus fumigatus. 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 The outside notes confirm this. In the interim the prednisone helped the eosinophilia down to 600 cells per cubic millimeter. She was started on Cardizem 20 mg per day and then referred here.  Today  in the office she feels well on daily prednisone. Asthma control questionnaire shows an average a CQ of 0.8 showing good control. At night she is hardly ever waking up because of asthma symptoms. When she wakes up in the morning she has very mild symptoms. Her activities are not limited by asthma. In the daytime is a very little shortness of breath. She is wheezing only hardly. She's not using albuterol for rescue. All of this is in the past one week.   OV 08/30/2015  Chief Complaint  Patient presents with  . Follow-up    Pt states her breathing has worsened over the last 10 days. Pt states they found mold in her home. Pt c/o sinus congestion with green mucus and mild non prod cough.    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 She presents for follow-up. Today she tells me that they found mold in the ventilation ducts at home. Then apparently cleaning it and she is having a new ventilation system placed. She believes that this might be the cause of recent deterioration. Last 1 week she's having increased sinus congestion with green sinus drainage. She's having significant postnasal drip. Asthma symptoms are stable but she is worried that she is descending into asthma exacerbation. She is compliant with Spiriva, Dulera, prednisone and Singulair. Despite this she still has symptoms. She is open to the idea of add on therapy.    5. asthma questionnaire shows deterioration in symptoms but she attributes this to increased sinus drainage and not asthma exacerbation itself. She is waking up many times at night because of runny nose . When she wakes of asthma symptoms are mild. She is only very slightly limited in her activities because of asthma. She is a little short of breath because of asthma and she is wheezing a little of the time. She is using albuterol for rescue 1-2 times daily.  However exhaled nitric oxide is 37 ppb  and in the gray zone and much better than before   OV 10/19/2015  Chief Complaint  Patient presents with  . Follow-up    Pt states that she has been doing well. Pt c/o occasional cough and wheeze. Was seen at PCP last week and they told her she was wheezing slightly. Pt does not need albuterol HFA on a regular basis.     follow-up severe persistent asthmawith serological ABPA.   last visit decided to start nucala. Insurance approved but yet to start = logistical issues. Feels well. Off spiriva and pred. ACQ is 0.8 and ell controlled. Feno is 49.  Albuterol rescue use is 1 puff daily. She wheezes hardly. She experenceshortness o breath verylitle. She is not limited in her activities. She plays tenis. When she waes up in te morning he has very mldmpoms because of astha. She hardly ever akes upin the night because of asthma.    Asthma Control Panel 06/22/15 Dr Remus Blake office 07/11/2015  08/30/2015  10/19/2015   Current Med Regimen dulera + singulair pred daily since 1/26 or 07/03/15 + dulera + singulair Spiriva, dulera, p[rednisone, singulair Dulera, singulair (off pred and spiriva). Awaits nucala  ACQ 5 point- 1 week. wtd avg score. <1.0 is good control 0.75-1.25 is grey zone. >1.25 poor control. Delta 0.5 is clinically meaningful In acute attack 0.8 2.1 - mostly due t osinus 0.8  ACQ 7 point - 1 week. wtd avg score. <1.0 is good control 0.75-1.25 is grey zone. >1.25 poor control. Delta 0.5 is clinically meaningful  x    ACT - a GSK test - 4 week. Total score. Max is 25, Lower score is worse.  <19 = poor control  x    FeNO ppB 91 (blood eos 3000,IgE 224) - labs done 67mn after depot medrol shot..  28 37 49  FeV1     x  Planned intervention  for visit apergillus ppnt IgG - positive and Intra-dermal skin test for fumigatus positive - Dr WOrvil Feil add Spiriva. Plus daily prednisone to continue plus singulair.  Consider Nucala  [place], singulair and await nucala start       has a past medical history  of Bunion; Hammer toe; Arthritis; Hypothyroidism; Asthma; and GERD (gastroesophageal reflux disease).   reports that she has never smoked. She has never used smokeless tobacco.  Past Surgical History  Procedure Laterality Date  . Colonoscopy    . Polypectomy    . Cesarean section      x2  . Total knee arthroplasty Right   . Neck surgery      plates and screws in  . Rotator cuff repair Right     right  . Bunionectomy with hammertoe reconstruction Bilateral   . Nasal sinus surgery    . Great toe arthrodesis, interphalangeal joint      No Known Allergies   There is no immunization history on file for this patient.  Family History  Problem Relation Age of Onset  . Colon cancer Neg Hx   . Esophageal cancer Neg Hx   . Stomach cancer Neg Hx   . Rectal cancer Neg Hx   . Emphysema Mother   . Melanoma Father   . Heart disease Maternal Uncle   . Melanoma Paternal Uncle      Current outpatient prescriptions:  .  albuterol (PROVENTIL) (2.5 MG/3ML) 0.083% nebulizer solution, Take 2.5 mg by nebulization every 6 (six) hours as needed for wheezing or shortness of breath., Disp: , Rfl:  .  estradiol (VIVELLE-DOT) 0.05 MG/24HR patch, Place 1 patch onto the skin once a week., Disp: , Rfl:  .  fluticasone (FLONASE) 50 MCG/ACT nasal spray, Place 2 sprays into the nose daily., Disp: 16 g, Rfl: 2 .  levocetirizine (XYZAL) 5 MG tablet, Take 5 mg by mouth every evening., Disp: , Rfl:  .  levothyroxine (SYNTHROID, LEVOTHROID) 125 MCG tablet, Take 125 mcg by mouth daily before breakfast., Disp: , Rfl:  .  meloxicam (MOBIC) 15 MG tablet, Take 15 mg by mouth daily., Disp: , Rfl:  .  mometasone-formoterol (DULERA) 100-5 MCG/ACT AERO, Inhale 2 puffs into the lungs 2 (two) times daily., Disp: , Rfl:  .  montelukast (SINGULAIR) 10 MG tablet, Take 10 mg by mouth at bedtime., Disp: , Rfl:  .  omeprazole (PRILOSEC) 20 MG capsule, Take 20 mg by mouth daily., Disp: , Rfl:  .  PROAIR HFA 108 (90 BASE)  MCG/ACT inhaler, Inhale 2 puffs into the lungs every 6 (six) hours as needed for wheezing or shortness of breath. , Disp: , Rfl:  .  progesterone (PROMETRIUM) 100 MG capsule, Take 1 capsule by mouth at bedtime., Disp: , Rfl:  .  sodium chloride (OCEAN) 0.65 % SOLN nasal spray, Place 1 spray into both nostrils as needed for congestion. Nasal spray or Neti-pot, Disp: , Rfl:  .  valACYclovir (VALTREX) 1000 MG tablet, as needed., Disp: , Rfl:     Review of Systems     Objective:   Physical Exam  Constitutional: She is oriented to person, place, and time. She appears well-developed and well-nourished. No distress.  HENT:  Head: Normocephalic and atraumatic.  Right Ear: External ear normal.  Left Ear: External ear normal.  Mouth/Throat: Oropharynx is clear and moist. No oropharyngeal exudate.  Eyes: Conjunctivae and EOM are normal. Pupils are equal, round, and reactive to light. Right eye exhibits no discharge. Left eye exhibits no discharge. No scleral icterus.  Neck: Normal range of motion. Neck supple. No JVD present. No tracheal deviation present. No thyromegaly present.  Cardiovascular: Normal rate, regular rhythm, normal heart sounds and intact distal pulses.  Exam reveals no gallop and no friction rub.   No murmur heard. Pulmonary/Chest: Effort normal and breath sounds normal. No respiratory distress. She has no wheezes. She has no rales. She exhibits no tenderness.  Abdominal: Soft. Bowel sounds are normal.  She exhibits no distension and no mass. There is no tenderness. There is no rebound and no guarding.  Musculoskeletal: Normal range of motion. She exhibits no edema or tenderness.  Lymphadenopathy:    She has no cervical adenopathy.  Neurological: She is alert and oriented to person, place, and time. She has normal reflexes. No cranial nerve deficit. She exhibits normal muscle tone. Coordination normal.  Skin: Skin is warm and dry. No rash noted. She is not diaphoretic. No erythema.  No pallor.  Psychiatric: She has a normal mood and affect. Her behavior is normal. Judgment and thought content normal.  Vitals reviewed.   Filed Vitals:   10/19/15 1607  BP: 124/78  Pulse: 81  Height: 5' 7"  (1.702 m)  Weight: 149 lb (67.586 kg)  SpO2: 99%        Assessment:       ICD-9-CM ICD-10-CM   1. Severe persistent asthma, uncomplicated 606.00 K59.97 Nitric oxide       Plan:       Improved symptoms but still with ongoing some level of inflammation based on nitric oxide test   PLAN Continue, dulera, singulair and antihistamines Gald you are off prednisone Await nucala start - glad insurance approved it    Followup  12 weeks or sooner if needed    Dr. Brand Males, M.D., [facility].C.P Pulmonary and Critical Care Medicine Staff Physician [place] [facility] Pager: [phone], If no answer or between  15:00h - 7:00h: call [phone]  10/19/2015 4:38 PM

## 2015-10-19 NOTE — Patient Instructions (Signed)
.     ICD-9-CM ICD-10-CM   1. Severe persistent asthma, uncomplicated 123456 [id number] Nitric oxide    Improved symptoms but still with ongoing some level of inflammation based on nitric oxide test   PLAN Continue, dulera, singulair and antihistamines Gald you are off prednisone Await nucala start - glad insurance approved it    Followup  12 weeks or sooner if needed

## 2015-10-25 DIAGNOSIS — Z13820 Encounter for screening for osteoporosis: Secondary | ICD-10-CM | POA: Diagnosis not present

## 2015-10-25 DIAGNOSIS — Z01419 Encounter for gynecological examination (general) (routine) without abnormal findings: Secondary | ICD-10-CM | POA: Diagnosis not present

## 2015-10-25 DIAGNOSIS — Z1231 Encounter for screening mammogram for malignant neoplasm of breast: Secondary | ICD-10-CM | POA: Diagnosis not present

## 2015-10-25 DIAGNOSIS — Z6823 Body mass index (BMI) 23.0-23.9, adult: Secondary | ICD-10-CM | POA: Diagnosis not present

## 2015-11-23 DIAGNOSIS — Z23 Encounter for immunization: Secondary | ICD-10-CM | POA: Diagnosis not present

## 2015-11-23 DIAGNOSIS — E039 Hypothyroidism, unspecified: Secondary | ICD-10-CM | POA: Diagnosis not present

## 2015-12-07 DIAGNOSIS — D485 Neoplasm of uncertain behavior of skin: Secondary | ICD-10-CM | POA: Diagnosis not present

## 2015-12-07 DIAGNOSIS — Z85828 Personal history of other malignant neoplasm of skin: Secondary | ICD-10-CM | POA: Diagnosis not present

## 2015-12-07 DIAGNOSIS — L570 Actinic keratosis: Secondary | ICD-10-CM | POA: Diagnosis not present

## 2015-12-07 DIAGNOSIS — L821 Other seborrheic keratosis: Secondary | ICD-10-CM | POA: Diagnosis not present

## 2015-12-18 ENCOUNTER — Encounter: Payer: Self-pay | Admitting: Podiatry

## 2015-12-18 ENCOUNTER — Ambulatory Visit (INDEPENDENT_AMBULATORY_CARE_PROVIDER_SITE_OTHER): Payer: BLUE CROSS/BLUE SHIELD

## 2015-12-18 ENCOUNTER — Ambulatory Visit (INDEPENDENT_AMBULATORY_CARE_PROVIDER_SITE_OTHER): Payer: BLUE CROSS/BLUE SHIELD | Admitting: Podiatry

## 2015-12-18 VITALS — BP 119/74 | HR 71 | Resp 16

## 2015-12-18 DIAGNOSIS — M779 Enthesopathy, unspecified: Secondary | ICD-10-CM

## 2015-12-18 DIAGNOSIS — M79671 Pain in right foot: Secondary | ICD-10-CM | POA: Diagnosis not present

## 2015-12-18 DIAGNOSIS — M79672 Pain in left foot: Secondary | ICD-10-CM

## 2015-12-20 NOTE — Progress Notes (Signed)
Subjective:     Patient ID: [name], female   DOB: 1961/05/02, 55 y.o.   MRN: [id number]  HPI patient presents stating I been getting pain around my big toe joints of both feet and I probably need new orthotics   Review of Systems     Objective:   Physical Exam Neurovascular status found to be intact muscle strength adequate with incision sites in place first MPJ bilateral with reduction of motion first MPJ right and normal motion on the left with no crepitus noted bilateral. Patient has discomfort around the big toe joint bilateral    Assessment:     Previous hallux limitus surgery bilateral with implant right and osteotomy left with inflammation occurring around the joint surfaces    Plan:     Reviewed condition and recommended long-term orthotics to reduce stress on the joints and possible injections. Patient was scanned for custom orthotics at this time

## 2015-12-27 DIAGNOSIS — M79645 Pain in left finger(s): Secondary | ICD-10-CM | POA: Diagnosis not present

## 2015-12-27 DIAGNOSIS — M79642 Pain in left hand: Secondary | ICD-10-CM | POA: Diagnosis not present

## 2015-12-27 DIAGNOSIS — M1811 Unilateral primary osteoarthritis of first carpometacarpal joint, right hand: Secondary | ICD-10-CM | POA: Diagnosis not present

## 2015-12-27 DIAGNOSIS — M1812 Unilateral primary osteoarthritis of first carpometacarpal joint, left hand: Secondary | ICD-10-CM | POA: Diagnosis not present

## 2015-12-27 DIAGNOSIS — M180 Bilateral primary osteoarthritis of first carpometacarpal joints: Secondary | ICD-10-CM | POA: Diagnosis not present

## 2016-01-03 DIAGNOSIS — E039 Hypothyroidism, unspecified: Secondary | ICD-10-CM | POA: Diagnosis not present

## 2016-01-03 DIAGNOSIS — R42 Dizziness and giddiness: Secondary | ICD-10-CM | POA: Diagnosis not present

## 2016-01-03 DIAGNOSIS — R5383 Other fatigue: Secondary | ICD-10-CM | POA: Diagnosis not present

## 2016-01-05 ENCOUNTER — Other Ambulatory Visit: Payer: Self-pay | Admitting: Family Medicine

## 2016-01-05 ENCOUNTER — Ambulatory Visit
Admission: RE | Admit: 2016-01-05 | Discharge: 2016-01-05 | Disposition: A | Discharge status: Home / Self Care | Payer: BLUE CROSS/BLUE SHIELD | Source: Ambulatory Visit | Attending: Family Medicine | Admitting: Family Medicine

## 2016-01-05 DIAGNOSIS — R1032 Left lower quadrant pain: Secondary | ICD-10-CM

## 2016-01-05 DIAGNOSIS — R531 Weakness: Secondary | ICD-10-CM | POA: Diagnosis not present

## 2016-01-05 DIAGNOSIS — N3090 Cystitis, unspecified without hematuria: Secondary | ICD-10-CM | POA: Diagnosis not present

## 2016-01-05 MED ORDER — IOPAMIDOL (ISOVUE-300) INJECTION 61%
100.0000 mL | Freq: Once | INTRAVENOUS | Status: AC | PRN
  Administered 2016-01-05: 100 mL via INTRAVENOUS

## 2016-01-15 ENCOUNTER — Ambulatory Visit: Payer: BLUE CROSS/BLUE SHIELD | Admitting: Internal Medicine

## 2016-01-24 ENCOUNTER — Ambulatory Visit: Payer: BLUE CROSS/BLUE SHIELD | Admitting: *Deleted

## 2016-01-24 DIAGNOSIS — M79671 Pain in right foot: Secondary | ICD-10-CM

## 2016-01-24 DIAGNOSIS — M79672 Pain in left foot: Principal | ICD-10-CM

## 2016-01-24 NOTE — Patient Instructions (Signed)

## 2016-01-24 NOTE — Progress Notes (Signed)
Patient ID: [name], female   DOB: 23-Sep-1960, 55 y.o.   MRN: [id number]  Patient presents for orthotic pick up.  Verbal and written break in and wear instructions given.  Patient will follow up in 4 weeks if symptoms worsen or fail to improve.

## 2016-02-07 ENCOUNTER — Ambulatory Visit: Payer: BLUE CROSS/BLUE SHIELD | Admitting: Internal Medicine

## 2016-02-21 DIAGNOSIS — R946 Abnormal results of thyroid function studies: Secondary | ICD-10-CM | POA: Diagnosis not present

## 2016-03-01 DIAGNOSIS — J45901 Unspecified asthma with (acute) exacerbation: Secondary | ICD-10-CM | POA: Diagnosis not present

## 2016-03-01 DIAGNOSIS — J209 Acute bronchitis, unspecified: Secondary | ICD-10-CM | POA: Diagnosis not present

## 2016-03-20 ENCOUNTER — Ambulatory Visit: Payer: BLUE CROSS/BLUE SHIELD | Admitting: Internal Medicine

## 2016-04-02 DIAGNOSIS — R946 Abnormal results of thyroid function studies: Secondary | ICD-10-CM | POA: Diagnosis not present

## 2016-04-16 DIAGNOSIS — J3089 Other allergic rhinitis: Secondary | ICD-10-CM | POA: Diagnosis not present

## 2016-04-16 DIAGNOSIS — J4541 Moderate persistent asthma with (acute) exacerbation: Secondary | ICD-10-CM | POA: Diagnosis not present

## 2016-04-16 DIAGNOSIS — J4540 Moderate persistent asthma, uncomplicated: Secondary | ICD-10-CM | POA: Diagnosis not present

## 2016-04-16 DIAGNOSIS — B4481 Allergic bronchopulmonary aspergillosis: Secondary | ICD-10-CM | POA: Diagnosis not present

## 2016-04-22 DIAGNOSIS — Z23 Encounter for immunization: Secondary | ICD-10-CM | POA: Diagnosis not present

## 2016-05-06 DIAGNOSIS — E039 Hypothyroidism, unspecified: Secondary | ICD-10-CM | POA: Diagnosis not present

## 2016-05-17 ENCOUNTER — Telehealth: Payer: Self-pay | Admitting: Internal Medicine

## 2016-05-17 NOTE — Telephone Encounter (Signed)
Is she on nucala?  Dr. Brand Males, M.D., [facility].C.P Pulmonary and Critical Care Medicine Staff Physician [place] [facility] Pager: [phone], If no answer or between  15:00h - 7:00h: call [phone]  05/17/2016 5:22 PM

## 2016-05-24 NOTE — Telephone Encounter (Signed)
From looking in her chart, does not look like she is on Nucala.

## 2016-05-29 NOTE — Telephone Encounter (Signed)
lmtcb for pt.  

## 2016-05-29 NOTE — Telephone Encounter (Signed)
[name]  Can you find out from her if she decided against Nucala or is getting it from elsewhere ?  Last OV 10/19/15 was to start Nucala  Thanks  Dr. Brand Males, M.D., [facility].C.P Pulmonary and Critical Care Medicine Staff Physician [place] [facility] Pager: [phone], If no answer or between  15:00h - 7:00h: call [phone]  05/29/2016 8:45 AM

## 2016-06-03 NOTE — Telephone Encounter (Signed)
lmtcb for pt.  

## 2016-06-04 ENCOUNTER — Telehealth: Payer: Self-pay | Admitting: Internal Medicine

## 2016-06-04 NOTE — Telephone Encounter (Signed)
Daneil Dan  Can you find out from her if she decided against Nucala or is getting it from elsewhere ?  Last OV 10/19/15 was to start Nucala  Thanks  lmomtcb x 2

## 2016-06-05 NOTE — Telephone Encounter (Signed)
Called and spoke to pt. Pt states she was informed by her PCP that she would not qualify because of the specific lab is within normal range. Advised pt to have results faxed to our office. Pt verbalized understanding.   Will send to Dr. Chase Caller as Juluis Rainier.

## 2016-06-05 NOTE — Telephone Encounter (Signed)
Duplicate message. See telephone encounter from 05/17/16.

## 2016-06-06 NOTE — Telephone Encounter (Signed)
lmtcb for pt. Will route back to [facility] for follow up.

## 2016-06-06 NOTE — Telephone Encounter (Signed)
That is odd because in agugust 2016 she had 700 eos /cu mm. Anways, you can give fu per prior OV plan or first available  THanks  Dr. Brand Males, M.D., [facility].C.P Pulmonary and Critical Care Medicine Staff Physician [place] [facility] Pager: [phone], If no answer or between  15:00h - 7:00h: call [phone]  06/06/2016 5:23 PM

## 2016-06-10 ENCOUNTER — Other Ambulatory Visit: Payer: Self-pay | Admitting: Plastic Surgery

## 2016-06-10 DIAGNOSIS — B078 Other viral warts: Secondary | ICD-10-CM | POA: Diagnosis not present

## 2016-06-10 DIAGNOSIS — L821 Other seborrheic keratosis: Secondary | ICD-10-CM | POA: Diagnosis not present

## 2016-06-10 DIAGNOSIS — Z85828 Personal history of other malignant neoplasm of skin: Secondary | ICD-10-CM | POA: Diagnosis not present

## 2016-06-10 DIAGNOSIS — D225 Melanocytic nevi of trunk: Secondary | ICD-10-CM | POA: Diagnosis not present

## 2016-06-10 DIAGNOSIS — L570 Actinic keratosis: Secondary | ICD-10-CM | POA: Diagnosis not present

## 2016-06-10 NOTE — Telephone Encounter (Signed)
lmtcb X2 for pt.  

## 2016-06-14 NOTE — Telephone Encounter (Signed)
lmtcb for pt.  

## 2016-06-17 ENCOUNTER — Encounter: Payer: Self-pay | Admitting: Emergency Medicine

## 2016-06-17 NOTE — Telephone Encounter (Signed)
lmtcb for pt. Letter sent to pt to contact our office to schedule f/u visit.  Will sign off due to several unsuccessful attempts to reach pt.

## 2016-07-01 DIAGNOSIS — B078 Other viral warts: Secondary | ICD-10-CM | POA: Diagnosis not present

## 2016-07-05 DIAGNOSIS — J45901 Unspecified asthma with (acute) exacerbation: Secondary | ICD-10-CM | POA: Diagnosis not present

## 2016-07-05 DIAGNOSIS — J029 Acute pharyngitis, unspecified: Secondary | ICD-10-CM | POA: Diagnosis not present

## 2016-07-05 DIAGNOSIS — J329 Chronic sinusitis, unspecified: Secondary | ICD-10-CM | POA: Diagnosis not present

## 2016-07-05 DIAGNOSIS — R6889 Other general symptoms and signs: Secondary | ICD-10-CM | POA: Diagnosis not present

## 2016-07-19 DIAGNOSIS — F4322 Adjustment disorder with anxiety: Secondary | ICD-10-CM | POA: Diagnosis not present

## 2016-07-22 DIAGNOSIS — L570 Actinic keratosis: Secondary | ICD-10-CM | POA: Diagnosis not present

## 2016-07-22 DIAGNOSIS — Z85828 Personal history of other malignant neoplasm of skin: Secondary | ICD-10-CM | POA: Diagnosis not present

## 2016-07-22 DIAGNOSIS — L821 Other seborrheic keratosis: Secondary | ICD-10-CM | POA: Diagnosis not present

## 2016-07-22 DIAGNOSIS — I788 Other diseases of capillaries: Secondary | ICD-10-CM | POA: Diagnosis not present

## 2016-07-22 DIAGNOSIS — B078 Other viral warts: Secondary | ICD-10-CM | POA: Diagnosis not present

## 2016-08-19 DIAGNOSIS — B078 Other viral warts: Secondary | ICD-10-CM | POA: Diagnosis not present

## 2016-08-21 ENCOUNTER — Telehealth: Payer: Self-pay | Admitting: Internal Medicine

## 2016-08-21 NOTE — Telephone Encounter (Addendum)
Called pt. And ask her tcb and let me know if she has access to a fax machine or not. I have a paper, I need her to fill out, sign, and fax to [facility] [place] if she has a fax machine. Waiting for pt. To return my call.

## 2016-08-27 NOTE — Telephone Encounter (Signed)
Still haven't heard form pt., cb today and left another message.

## 2016-08-28 DIAGNOSIS — J3089 Other allergic rhinitis: Secondary | ICD-10-CM | POA: Diagnosis not present

## 2016-08-28 DIAGNOSIS — J4540 Moderate persistent asthma, uncomplicated: Secondary | ICD-10-CM | POA: Diagnosis not present

## 2016-08-28 DIAGNOSIS — J0100 Acute maxillary sinusitis, unspecified: Secondary | ICD-10-CM | POA: Diagnosis not present

## 2016-08-28 DIAGNOSIS — B4481 Allergic bronchopulmonary aspergillosis: Secondary | ICD-10-CM | POA: Diagnosis not present

## 2016-09-05 NOTE — Telephone Encounter (Signed)
ATC, left another message tcb. Ask pt. For fax # again or to come by and fill out papers so we can order her nucala.

## 2016-09-23 ENCOUNTER — Other Ambulatory Visit: Payer: Self-pay | Admitting: Orthopedic Surgery

## 2016-09-23 ENCOUNTER — Ambulatory Visit
Admission: RE | Admit: 2016-09-23 | Discharge: 2016-09-23 | Disposition: A | Discharge status: Home / Self Care | Payer: BLUE CROSS/BLUE SHIELD | Source: Ambulatory Visit | Attending: Orthopedic Surgery | Admitting: Orthopedic Surgery

## 2016-09-23 ENCOUNTER — Other Ambulatory Visit: Payer: Self-pay | Admitting: Physical Medicine and Rehabilitation

## 2016-09-23 DIAGNOSIS — M5414 Radiculopathy, thoracic region: Secondary | ICD-10-CM

## 2016-09-23 DIAGNOSIS — M5416 Radiculopathy, lumbar region: Secondary | ICD-10-CM

## 2016-09-23 DIAGNOSIS — S299XXA Unspecified injury of thorax, initial encounter: Secondary | ICD-10-CM

## 2016-09-23 DIAGNOSIS — M546 Pain in thoracic spine: Secondary | ICD-10-CM | POA: Diagnosis not present

## 2016-09-23 DIAGNOSIS — M1812 Unilateral primary osteoarthritis of first carpometacarpal joint, left hand: Secondary | ICD-10-CM | POA: Diagnosis not present

## 2016-09-23 DIAGNOSIS — R918 Other nonspecific abnormal finding of lung field: Secondary | ICD-10-CM | POA: Diagnosis not present

## 2016-09-23 DIAGNOSIS — R0789 Other chest pain: Secondary | ICD-10-CM | POA: Diagnosis not present

## 2016-09-23 DIAGNOSIS — M48061 Spinal stenosis, lumbar region without neurogenic claudication: Secondary | ICD-10-CM | POA: Diagnosis not present

## 2016-09-23 DIAGNOSIS — M180 Bilateral primary osteoarthritis of first carpometacarpal joints: Secondary | ICD-10-CM | POA: Diagnosis not present

## 2016-09-23 DIAGNOSIS — M47816 Spondylosis without myelopathy or radiculopathy, lumbar region: Secondary | ICD-10-CM | POA: Diagnosis not present

## 2016-09-23 DIAGNOSIS — M1811 Unilateral primary osteoarthritis of first carpometacarpal joint, right hand: Secondary | ICD-10-CM | POA: Diagnosis not present

## 2016-09-24 DIAGNOSIS — J338 Other polyp of sinus: Secondary | ICD-10-CM | POA: Diagnosis not present

## 2016-09-24 DIAGNOSIS — J4551 Severe persistent asthma with (acute) exacerbation: Secondary | ICD-10-CM | POA: Diagnosis not present

## 2016-09-24 DIAGNOSIS — B078 Other viral warts: Secondary | ICD-10-CM | POA: Diagnosis not present

## 2016-09-30 DIAGNOSIS — J324 Chronic pansinusitis: Secondary | ICD-10-CM | POA: Diagnosis not present

## 2016-09-30 DIAGNOSIS — J339 Nasal polyp, unspecified: Secondary | ICD-10-CM | POA: Diagnosis not present

## 2016-10-01 ENCOUNTER — Ambulatory Visit: Payer: BLUE CROSS/BLUE SHIELD | Admitting: Internal Medicine

## 2016-10-02 DIAGNOSIS — J4531 Mild persistent asthma with (acute) exacerbation: Secondary | ICD-10-CM | POA: Diagnosis not present

## 2016-10-02 DIAGNOSIS — S2232XS Fracture of one rib, left side, sequela: Secondary | ICD-10-CM | POA: Diagnosis not present

## 2016-10-02 DIAGNOSIS — J0111 Acute recurrent frontal sinusitis: Secondary | ICD-10-CM | POA: Diagnosis not present

## 2016-10-10 ENCOUNTER — Other Ambulatory Visit: Payer: Self-pay | Admitting: Otolaryngology

## 2016-10-10 DIAGNOSIS — J329 Chronic sinusitis, unspecified: Secondary | ICD-10-CM | POA: Diagnosis not present

## 2016-10-10 DIAGNOSIS — J323 Chronic sphenoidal sinusitis: Secondary | ICD-10-CM | POA: Diagnosis not present

## 2016-10-10 DIAGNOSIS — J322 Chronic ethmoidal sinusitis: Secondary | ICD-10-CM | POA: Diagnosis not present

## 2016-10-10 DIAGNOSIS — J321 Chronic frontal sinusitis: Secondary | ICD-10-CM | POA: Diagnosis not present

## 2016-10-10 DIAGNOSIS — J338 Other polyp of sinus: Secondary | ICD-10-CM | POA: Diagnosis not present

## 2016-10-10 DIAGNOSIS — J320 Chronic maxillary sinusitis: Secondary | ICD-10-CM | POA: Diagnosis not present

## 2016-10-15 NOTE — Telephone Encounter (Signed)
I get the impression I'm bothering [name], I will keep trying, 10/16/16. Closing note.

## 2016-10-24 ENCOUNTER — Ambulatory Visit (INDEPENDENT_AMBULATORY_CARE_PROVIDER_SITE_OTHER): Payer: BLUE CROSS/BLUE SHIELD | Admitting: Internal Medicine

## 2016-10-24 ENCOUNTER — Encounter: Payer: Self-pay | Admitting: Internal Medicine

## 2016-10-24 DIAGNOSIS — J4540 Moderate persistent asthma, uncomplicated: Secondary | ICD-10-CM | POA: Diagnosis not present

## 2016-10-24 DIAGNOSIS — J0100 Acute maxillary sinusitis, unspecified: Secondary | ICD-10-CM | POA: Diagnosis not present

## 2016-10-24 LAB — NITRIC OXIDE: Nitric Oxide: 17

## 2016-10-24 NOTE — Addendum Note (Signed)
Addended by: Collier Salina on: 10/24/2016 12:02 PM   Modules accepted: Orders

## 2016-10-24 NOTE — Assessment & Plan Note (Signed)
Well controlled asthma  Plan Continue dulera and singulair scheduled Use albuterol as needed Warm up and cool down before exercise Allergy followup with Dr Remus Blake Sinus followup with Dr Wilburn Cornelia FLu shot in fall 2018 Please talk to PCP Harlan Stains, MDand ensure you get  shingarix vaccine  Followup 1 year or sooner if neded

## 2016-10-24 NOTE — Progress Notes (Signed)
Subjective:     Patient ID: [name], female   DOB: 1960-10-25, 56 y.o.   MRN: [id number]  HPI    PCP Vidal Schwalbe, MD Allergist - Dr Orvil Feil ENT - Dr Cy Blamer March 2015 with Dr Joya Gaskins /13/2015 Chief Complaint  Patient presents with  . 1 month follow up    Cough has improved - prod now with green mucus.  C/o constant dull pain under left breast x 3 wks; pain is worse and changes to stabing pain with cough or when inhaling deeply.  Cough is better.  Not as much wheezing.  Pt had strep throat, EGD was normal, esophagus neg for GERD.   Three weeks ago severe pain L chest area.  Dull ache if deep breath hurts.   Pt still using symbicort prn.   No real post nasal drip.  rx more abx for strep throat.   email from [facility] 06/22/15 I'm sending a patient to you named [name] DOB [birth year]. She was evaluated by Dr Joya Gaskins in the past.  She has had recurrent dyspneic episodes over the past few years which have worsened over the past few months despite ICS/Laba treatment. Her aeroallergen testing has been completely negative. Today I saw her for an episode. She was actively wheezing with a pulse ox of 93%. I did not perform one today but a FENO done in March 2015 resulted as 91 ppb. She follows with Dr Wilburn Cornelia for sinus disease and is s/p ENSS. Today I sent an immune work up, IgE, ANCAs and ABPA labs. She has had a clear CT chest in the past. I will send you all documentation. I'm asking you to see her as she is not atopic and am not sure what else could be causing such frequent and severe episodes.   Thank you in advance for seeing her. Hope to see you soon.   Sincerely, Meg   email from Dr Remus Blake on Jun 30, 2015 12:50 PM --- Thanks, [name]. She has an appt with you on 2/14. Some of her labs have resulted. She has an elevated Eosinophil count of 3,000 and a modestly elevated IgE of 224. Her Aspergillus titers are pending. Highly suspicious for ABPA (ANCAs and ESR were normal)  or Churgg Strauss. Will order additional blood work if they come back negative to work up the eosinophilia. If APBA will start on Pred pending her apppointment with you. I'm out next week but have signed everything out to [place]. Will fax you all blood work when resulted.  Thank you! Meg   OV 07/11/2015 Chief Complaint  Patient presents with  . Follow-up    Former PW pt. Pt states her breathing is doing well. Pt c/o mild dry cough. Pt denies CP/tightness. Pt states she has an upcoming CT sinus.    This is a follow-up for chronic cough and asthma symptoms. Used to see Dr. Asencion Noble over 2 years ago. Now referred by Dr. Remus Blake allergist for complicated asthma and suspicion of ABPA.  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 Reports she is a nonsmoker. Then some 3-1/2 years ago 1 day woke up and started having shortness of breath with nasal congestion and since then she's been on well. This resulted in ENT evaluation and allergy evaluation. She says that she's had 2 sinus surgeries and has had multiple asthma exacerbations. She recollects at least 12 prednisone courses in the last 2 years - -3 years with the longest  one being 30 days. She thinks that a total of 70 days in the last 2 years she's been on prednisone. There is on treatments always associated with anabiotic's. She's been to the emergency room or urgent care at least 6 times in the last 2 years but no hospitalizations. Symptoms are typically improved when she travels outside of [place]. Symptoms are mainly chest tightness, wheezing, dyspnea, significant sinus congestion. Her maintenance treatment for the last 3 years has been [place] and Singulair daily associated with pro-air.   In phone call with Dr Orvil Feil 07/12/2015 - she told me that patient only had dx of asthma with negative skin test including aspergillus few years ago. Her decline has been only in last 6 months   Relevant investigations as  ascertain from her and review of the charts -2 years ago skin test for allergies was negative at Dr. Lilli Few office including aspergillus skin test 2014 CT sinus pansinusitis and since then has had sinus surgery 2 with one being in 2014 and 07/26/2013. She has upcoming sinus CT with Dr. Wilburn Cornelia 2015 upper endoscopy with Dr. Henrene Pastor apparently normal per history -2016 August had acute dyspnea and had CT angiogram chest which ruled out pulmonary embolism but showed normal lung parenchyma. Personally visualized. -3875 March cyclical cough treatment with Dr. Asencion Noble temporarily helped. - Most recently it appears Dr. Remus Blake per her email as stated above and based on patient's complex persistent history started looking for asthma associated disease states or complications. 06/22/2015 patient was in severe exacerbation with a high exhaled nitric oxide of 91. Patient was treated with prednisone and antibiotics. On the same day she had blood work that showed eosinophils of 3000 absolute along with a slightly elevated IgE of 224 (done 30 min after a steroid shot and ? > 5d after last dose of pre) but rest of the immunoglobulin profile was normal. Patient also had MPO and PR 3 antibodies for vasculitis and this was normal. I personally visualize these results. Patient had a sedimentation rate of 6. She was tested for diphtheria antibody and tetanus toxin and these appear normal. 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 Fredderick Phenix on 07/03/2015 as follow-up and she was feeling better. She recollects having had a skin test for Aspergillus fumigatus. 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 The outside notes confirm this. In the interim the prednisone helped the eosinophilia down to 600 cells per cubic millimeter. She was started on Cardizem 20 mg per day and then referred  here.  Today in the office she feels well on daily prednisone. Asthma control questionnaire shows an average a CQ of 0.8 showing good control. At night she is hardly ever waking up because of asthma symptoms. When she wakes up in the morning she has very mild symptoms. Her activities are not limited by asthma. In the daytime is a very little shortness of breath. She is wheezing only hardly. She's not using albuterol for rescue. All of this is in the past one week.   OV 08/30/2015  Chief Complaint  Patient presents with  . Follow-up    Pt states her breathing has worsened over the last 10 days. Pt states they found mold in her home. Pt c/o sinus congestion with green mucus and mild non prod cough.    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 She presents for follow-up. Today she tells me that they found mold in the ventilation ducts at home. Then apparently cleaning it and she is having a new ventilation system placed. She believes that this might be the cause of recent deterioration. Last 1 week she's having increased sinus congestion with green sinus drainage. She's having significant postnasal drip. Asthma symptoms are stable but she is worried that she is descending into asthma exacerbation. She is compliant with Spiriva, Dulera, prednisone and Singulair. Despite this she still has symptoms. She is open to the idea of add on therapy.    5. asthma questionnaire shows deterioration in symptoms but she attributes this to increased sinus drainage and not asthma exacerbation itself. She is waking up many times at night because of runny nose . When she wakes of asthma symptoms are mild. She is only very slightly limited in her activities because of asthma. She is a little short of breath because of asthma and she is wheezing a little of the time. She is using albuterol for rescue 1-2 times daily.  However exhaled nitric  oxide is 37 ppb and in the gray zone and much better than before   OV 10/19/2015  Chief Complaint  Patient presents with  . Follow-up    Pt states that she has been doing well. Pt c/o occasional cough and wheeze. Was seen at PCP last week and they told her she was wheezing slightly. Pt does not need albuterol HFA on a regular basis.     follow-up severe persistent asthmawith serological ABPA.   last visit decided to start nucala. Insurance approved but yet to start = logistical issues. Feels well. Off spiriva and pred. ACQ is 0.8 and ell controlled. Feno is 49.  Albuterol rescue use is 1 puff daily. She wheezes hardly. She experenceshortness o breath verylitle. She is not limited in her activities. She plays tenis. When she waes up in te morning he has very mldmpoms because of astha. She hardly ever akes upin the night because of asthma.    OV 10/24/2016  Chief Complaint  Patient presents with  . Follow-up    Pt states she had sinus surgery a few weeks ago and her breathing is doing well. Pt denies cough, CP/tightness and f/c/s.    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  I'm not seen her in one year. She presents for follow-up. She says that overall she's had 3 sinus surgeries including one a few weeks ago with some steroid packing. She states her asthma health has gotten to be excellent with very little symptoms. No albuterol use.  feno  is normal first time. Up-to-date with her pneumonia vaccine through her allergist. She knows she needs to take flu shot. She has not yet had the shingles vaccine.  4 weeks ago while in [place] fell down by accident and fractured 11th rib posteriorly   Asthma Contr l Panel 700 eos aug 2016 06/22/15 Dr Remus Blake office 07/11/2015  08/30/2015  10/19/2015  10/24/2016   Current Med Regimen dulera + singulair pred daily since 1/26 or 07/03/15 + dulera + singulair Spiriva, dulera, p[rednisone, singulair Dulera, singulair (off pred and spiriva).  Awaits nucala Singulair, and dulera  ACQ 5 point- 1 week. wtd avg score. <1.0 is good control 0.75-1.25 is grey zone. >1.25 poor control. Delta 0.5 is clinically meaningful In acute attack 0.8 2.1 - mostly due t osinus 0.8   FeNO ppB 91 (blood eos 3000,IgE 224) - labs  done 45mn after depot medrol shot..  28 37 49 17 ppb  FeV1     x   Planned intervention  for visit apergillus ppnt IgG - positive and Intra-dermal skin test for fumigatus positive - Dr WOrvil Feil add Spiriva. Plus daily prednisone to continue plus singulair.  Consider NElenora Fender singulair and await nucala start duleara and singulair       has a past medical history of Arthritis; Asthma; Bunion; GERD (gastroesophageal reflux disease); Hammer toe; and Hypothyroidism.   reports that she has never smoked. She has never used smokeless tobacco.  Past Surgical History:  Procedure Laterality Date  . BUNIONECTOMY WITH HAMMERTOE RECONSTRUCTION Bilateral   . CESAREAN SECTION     x2  . COLONOSCOPY    . GREAT TOE ARTHRODESIS, INTERPHALANGEAL JOINT    . NASAL SINUS SURGERY    . NECK SURGERY     plates and screws in  . POLYPECTOMY    . ROTATOR CUFF REPAIR Right    right  . TOTAL KNEE ARTHROPLASTY Right     No Known Allergies  Immunization History  Administered Date(s) Administered  . Pneumococcal-Unspecified 05/27/2016    Family History  Problem Relation Age of Onset  . Emphysema Mother   . Melanoma Father   . Heart disease Maternal Uncle   . Melanoma Paternal Uncle   . Colon cancer Neg Hx   . Esophageal cancer Neg Hx   . Stomach cancer Neg Hx   . Rectal cancer Neg Hx      Current Outpatient Prescriptions:  .  albuterol (PROVENTIL) (2.5 MG/3ML) 0.083% nebulizer solution, Take 2.5 mg by nebulization every 6 (six) hours as needed for wheezing or shortness of breath., Disp: , Rfl:  .  fluticasone (FLONASE) 50 MCG/ACT nasal spray, Place 2 sprays into the nose daily., Disp: 16 g, Rfl: 2 .  levothyroxine (SYNTHROID,  LEVOTHROID) 125 MCG tablet, Take 125 mcg by mouth daily before breakfast., Disp: , Rfl:  .  mometasone-formoterol (DULERA) 100-5 MCG/ACT AERO, Inhale 2 puffs into the lungs 2 (two) times daily., Disp: , Rfl:  .  montelukast (SINGULAIR) 10 MG tablet, Take 10 mg by mouth at bedtime., Disp: , Rfl:  .  omeprazole (PRILOSEC) 20 MG capsule, Take 20 mg by mouth daily., Disp: , Rfl:  .  PROAIR HFA 108 (90 BASE) MCG/ACT inhaler, Inhale 2 puffs into the lungs every 6 (six) hours as needed for wheezing or shortness of breath. , Disp: , Rfl:  .  sodium chloride (OCEAN) 0.65 % SOLN nasal spray, Place 1 spray into both nostrils as needed for congestion. Nasal spray or Neti-pot, Disp: , Rfl:  .  valACYclovir (VALTREX) 1000 MG tablet, as needed., Disp: , Rfl:  .  estradiol (VIVELLE-DOT) 0.05 MG/24HR patch, Place 1 patch onto the skin once a week., Disp: , Rfl:  .  levocetirizine (XYZAL) 5 MG tablet, Take 5 mg by mouth every evening., Disp: , Rfl:  .  meloxicam (MOBIC) 15 MG tablet, Take 15 mg by mouth daily., Disp: , Rfl:  .  progesterone (PROMETRIUM) 100 MG capsule, Take 1 capsule by mouth at bedtime., Disp: , Rfl:    Review of Systems     Objective:   Physical Exam  Constitutional: She is oriented to person, place, and time. She appears well-developed and well-nourished. No distress.  HENT:  Head: Normocephalic and atraumatic.  Right Ear: External ear normal.  Left Ear: External ear normal.  Mouth/Throat: Oropharynx is clear and moist. No oropharyngeal  exudate.  Eyes: Conjunctivae and EOM are normal. Pupils are equal, round, and reactive to light. Right eye exhibits no discharge. Left eye exhibits no discharge. No scleral icterus.  Neck: Normal range of motion. Neck supple. No JVD present. No tracheal deviation present. No thyromegaly present.  Cardiovascular: Normal rate, regular rhythm, normal heart sounds and intact distal pulses.  Exam reveals no gallop and no friction rub.   No murmur  heard. Pulmonary/Chest: Effort normal and breath sounds normal. No respiratory distress. She has no wheezes. She has no rales. She exhibits no tenderness.  Abdominal: Soft. Bowel sounds are normal. She exhibits no distension and no mass. There is no tenderness. There is no rebound and no guarding.  Musculoskeletal: Normal range of motion. She exhibits no edema or tenderness.  Lymphadenopathy:    She has no cervical adenopathy.  Neurological: She is alert and oriented to person, place, and time. She has normal reflexes. No cranial nerve deficit. She exhibits normal muscle tone. Coordination normal.  Skin: Skin is warm and dry. No rash noted. She is not diaphoretic. No erythema. No pallor.  Psychiatric: She has a normal mood and affect. Her behavior is normal. Judgment and thought content normal.  Vitals reviewed.   Vitals:   10/24/16 0911  BP: 114/88  Pulse: 76  SpO2: 98%  Weight: 135 lb 12.8 oz (61.6 kg)  Height: 5' 7"  (1.702 m)    Estimated body mass index is 21.27 kg/m as calculated from the following:   Height as of this encounter: 5' 7"  (1.702 m).   Weight as of this encounter: 135 lb 12.8 oz (61.6 kg).     Assessment:       ICD-9-CM ICD-10-CM   1. Asthma, well controlled, moderate persistent 493.90 J45.40        Plan:     Asthma, well controlled, moderate persistent Well controlled asthma  Plan Continue dulera and singulair scheduled Use albuterol as needed Warm up and cool down before exercise Allergy followup with Dr Remus Blake Sinus followup with Dr Wilburn Cornelia FLu shot in fall 2018 Please talk to PCP Harlan Stains, MDand ensure you get  shingarix vaccine  Followup 1 year or sooner if neded     Dr. Brand Males, M.D., [facility].C.P Pulmonary and Critical Care Medicine Staff Physician [facility] Pager: [phone], If no answer or between  15:00h - 7:00h: call [phone]  10/24/2016 9:30 AM

## 2016-10-24 NOTE — Patient Instructions (Signed)
Asthma, well controlled, moderate persistent Well controlled asthma  Plan Continue dulera and singulair scheduled Use albuterol as needed Warm up and cool down before exercise Allergy followup with Dr Remus Blake Sinus followup with Dr Wilburn Cornelia FLu shot in fall 2018 Please talk to PCP Harlan Stains, MDand ensure you get  shingarix vaccine  Followup 1 year or sooner if neded

## 2016-10-28 DIAGNOSIS — Z01419 Encounter for gynecological examination (general) (routine) without abnormal findings: Secondary | ICD-10-CM | POA: Diagnosis not present

## 2016-10-28 DIAGNOSIS — Z6821 Body mass index (BMI) 21.0-21.9, adult: Secondary | ICD-10-CM | POA: Diagnosis not present

## 2016-10-29 DIAGNOSIS — B4481 Allergic bronchopulmonary aspergillosis: Secondary | ICD-10-CM | POA: Diagnosis not present

## 2016-10-29 DIAGNOSIS — J3089 Other allergic rhinitis: Secondary | ICD-10-CM | POA: Diagnosis not present

## 2016-10-29 DIAGNOSIS — J4540 Moderate persistent asthma, uncomplicated: Secondary | ICD-10-CM | POA: Diagnosis not present

## 2016-11-06 ENCOUNTER — Telehealth: Payer: Self-pay | Admitting: Internal Medicine

## 2016-11-06 NOTE — Telephone Encounter (Signed)
I tried to get in touch with Mrs. Gunn 3 different times. She saw MR 10/24/16 and there was no mention of nucala. Pt. Appears to be doing well on current regimen. Unless you (MR) think nucala will benefit her, I'm not going to presue this any further . Will route to MR to see what he wants me to do.

## 2016-11-14 NOTE — Telephone Encounter (Signed)
No need to do nucala right now. She is better  Dr. Brand Males, M.D., [facility].C.P Pulmonary and Critical Care Medicine Staff Physician [facility] Pager: [phone], If no answer or between  15:00h - 7:00h: call [phone]  11/14/2016 9:01 AM

## 2016-11-14 NOTE — Telephone Encounter (Signed)
Will route to [employer] to make aware.

## 2016-11-14 NOTE — Telephone Encounter (Signed)
Ok, thanks for letting me know. Closing note nothing further needed at this time.

## 2017-01-30 DIAGNOSIS — J4541 Moderate persistent asthma with (acute) exacerbation: Secondary | ICD-10-CM | POA: Diagnosis not present

## 2017-01-30 DIAGNOSIS — J4540 Moderate persistent asthma, uncomplicated: Secondary | ICD-10-CM | POA: Diagnosis not present

## 2017-01-30 DIAGNOSIS — J3089 Other allergic rhinitis: Secondary | ICD-10-CM | POA: Diagnosis not present

## 2017-01-30 DIAGNOSIS — B4481 Allergic bronchopulmonary aspergillosis: Secondary | ICD-10-CM | POA: Diagnosis not present

## 2017-03-17 DIAGNOSIS — L570 Actinic keratosis: Secondary | ICD-10-CM | POA: Diagnosis not present

## 2017-03-17 DIAGNOSIS — L820 Inflamed seborrheic keratosis: Secondary | ICD-10-CM | POA: Diagnosis not present

## 2017-03-17 DIAGNOSIS — L821 Other seborrheic keratosis: Secondary | ICD-10-CM | POA: Diagnosis not present

## 2017-03-17 DIAGNOSIS — Z85828 Personal history of other malignant neoplasm of skin: Secondary | ICD-10-CM | POA: Diagnosis not present

## 2017-03-19 DIAGNOSIS — M7062 Trochanteric bursitis, left hip: Secondary | ICD-10-CM | POA: Diagnosis not present

## 2017-03-19 DIAGNOSIS — M7061 Trochanteric bursitis, right hip: Secondary | ICD-10-CM | POA: Diagnosis not present

## 2017-04-14 DIAGNOSIS — Z13228 Encounter for screening for other metabolic disorders: Secondary | ICD-10-CM | POA: Diagnosis not present

## 2017-04-14 DIAGNOSIS — Z1329 Encounter for screening for other suspected endocrine disorder: Secondary | ICD-10-CM | POA: Diagnosis not present

## 2017-04-14 DIAGNOSIS — Z130 Encounter for screening for diseases of the blood and blood-forming organs and certain disorders involving the immune mechanism: Secondary | ICD-10-CM | POA: Diagnosis not present

## 2017-04-14 DIAGNOSIS — Z13220 Encounter for screening for lipoid disorders: Secondary | ICD-10-CM | POA: Diagnosis not present

## 2017-04-24 DIAGNOSIS — M1811 Unilateral primary osteoarthritis of first carpometacarpal joint, right hand: Secondary | ICD-10-CM | POA: Diagnosis not present

## 2017-04-24 DIAGNOSIS — M79642 Pain in left hand: Secondary | ICD-10-CM | POA: Diagnosis not present

## 2017-04-24 DIAGNOSIS — M1812 Unilateral primary osteoarthritis of first carpometacarpal joint, left hand: Secondary | ICD-10-CM | POA: Diagnosis not present

## 2017-04-24 DIAGNOSIS — M79641 Pain in right hand: Secondary | ICD-10-CM | POA: Diagnosis not present

## 2017-05-14 ENCOUNTER — Other Ambulatory Visit: Payer: Self-pay | Admitting: Family Medicine

## 2017-05-14 DIAGNOSIS — R202 Paresthesia of skin: Secondary | ICD-10-CM | POA: Diagnosis not present

## 2017-05-14 DIAGNOSIS — R42 Dizziness and giddiness: Secondary | ICD-10-CM | POA: Diagnosis not present

## 2017-05-14 DIAGNOSIS — S0003XA Contusion of scalp, initial encounter: Secondary | ICD-10-CM | POA: Diagnosis not present

## 2017-05-14 DIAGNOSIS — F4321 Adjustment disorder with depressed mood: Secondary | ICD-10-CM | POA: Diagnosis not present

## 2017-05-23 ENCOUNTER — Ambulatory Visit
Admission: RE | Admit: 2017-05-23 | Discharge: 2017-05-23 | Disposition: A | Discharge status: Home / Self Care | Payer: BLUE CROSS/BLUE SHIELD | Source: Ambulatory Visit | Attending: Family Medicine | Admitting: Family Medicine

## 2017-05-23 DIAGNOSIS — R200 Anesthesia of skin: Secondary | ICD-10-CM | POA: Diagnosis not present

## 2017-05-23 DIAGNOSIS — R202 Paresthesia of skin: Secondary | ICD-10-CM

## 2017-05-23 DIAGNOSIS — M47812 Spondylosis without myelopathy or radiculopathy, cervical region: Secondary | ICD-10-CM | POA: Diagnosis not present

## 2017-05-23 DIAGNOSIS — D509 Iron deficiency anemia, unspecified: Secondary | ICD-10-CM | POA: Diagnosis not present

## 2017-05-23 MED ORDER — GADOBENATE DIMEGLUMINE 529 MG/ML IV SOLN
12.0000 mL | Freq: Once | INTRAVENOUS | Status: AC | PRN
  Administered 2017-05-23: 12 mL via INTRAVENOUS

## 2017-05-25 DIAGNOSIS — I809 Phlebitis and thrombophlebitis of unspecified site: Secondary | ICD-10-CM | POA: Diagnosis not present

## 2017-06-23 DIAGNOSIS — B4481 Allergic bronchopulmonary aspergillosis: Secondary | ICD-10-CM | POA: Diagnosis not present

## 2017-06-23 DIAGNOSIS — J3089 Other allergic rhinitis: Secondary | ICD-10-CM | POA: Diagnosis not present

## 2017-06-23 DIAGNOSIS — J4540 Moderate persistent asthma, uncomplicated: Secondary | ICD-10-CM | POA: Diagnosis not present

## 2017-06-23 DIAGNOSIS — J069 Acute upper respiratory infection, unspecified: Secondary | ICD-10-CM | POA: Diagnosis not present

## 2017-06-27 DIAGNOSIS — E039 Hypothyroidism, unspecified: Secondary | ICD-10-CM | POA: Diagnosis not present

## 2017-07-02 DIAGNOSIS — M5412 Radiculopathy, cervical region: Secondary | ICD-10-CM | POA: Diagnosis not present

## 2017-07-02 DIAGNOSIS — M79602 Pain in left arm: Secondary | ICD-10-CM | POA: Diagnosis not present

## 2017-07-02 DIAGNOSIS — R209 Unspecified disturbances of skin sensation: Secondary | ICD-10-CM | POA: Diagnosis not present

## 2017-07-02 DIAGNOSIS — M79601 Pain in right arm: Secondary | ICD-10-CM | POA: Diagnosis not present

## 2017-07-02 DIAGNOSIS — R030 Elevated blood-pressure reading, without diagnosis of hypertension: Secondary | ICD-10-CM | POA: Diagnosis not present

## 2017-07-13 DIAGNOSIS — J101 Influenza due to other identified influenza virus with other respiratory manifestations: Secondary | ICD-10-CM | POA: Diagnosis not present

## 2017-07-13 DIAGNOSIS — J069 Acute upper respiratory infection, unspecified: Secondary | ICD-10-CM | POA: Diagnosis not present

## 2017-07-13 DIAGNOSIS — R509 Fever, unspecified: Secondary | ICD-10-CM | POA: Diagnosis not present

## 2017-07-13 DIAGNOSIS — J09X2 Influenza due to identified novel influenza A virus with other respiratory manifestations: Secondary | ICD-10-CM | POA: Diagnosis not present

## 2017-07-14 DIAGNOSIS — D2271 Melanocytic nevi of right lower limb, including hip: Secondary | ICD-10-CM | POA: Diagnosis not present

## 2017-07-14 DIAGNOSIS — L821 Other seborrheic keratosis: Secondary | ICD-10-CM | POA: Diagnosis not present

## 2017-07-14 DIAGNOSIS — D1801 Hemangioma of skin and subcutaneous tissue: Secondary | ICD-10-CM | POA: Diagnosis not present

## 2017-07-14 DIAGNOSIS — Z85828 Personal history of other malignant neoplasm of skin: Secondary | ICD-10-CM | POA: Diagnosis not present

## 2017-08-04 DIAGNOSIS — R202 Paresthesia of skin: Secondary | ICD-10-CM | POA: Diagnosis not present

## 2017-08-04 DIAGNOSIS — M79601 Pain in right arm: Secondary | ICD-10-CM | POA: Diagnosis not present

## 2017-09-13 DIAGNOSIS — J209 Acute bronchitis, unspecified: Secondary | ICD-10-CM | POA: Diagnosis not present

## 2017-09-13 DIAGNOSIS — J029 Acute pharyngitis, unspecified: Secondary | ICD-10-CM | POA: Diagnosis not present

## 2017-09-13 DIAGNOSIS — J019 Acute sinusitis, unspecified: Secondary | ICD-10-CM | POA: Diagnosis not present

## 2017-09-16 DIAGNOSIS — M79601 Pain in right arm: Secondary | ICD-10-CM | POA: Diagnosis not present

## 2017-09-16 DIAGNOSIS — R030 Elevated blood-pressure reading, without diagnosis of hypertension: Secondary | ICD-10-CM | POA: Diagnosis not present

## 2017-09-16 DIAGNOSIS — R209 Unspecified disturbances of skin sensation: Secondary | ICD-10-CM | POA: Diagnosis not present

## 2017-09-16 DIAGNOSIS — R202 Paresthesia of skin: Secondary | ICD-10-CM | POA: Diagnosis not present

## 2017-11-11 DIAGNOSIS — J324 Chronic pansinusitis: Secondary | ICD-10-CM | POA: Diagnosis not present

## 2017-11-11 DIAGNOSIS — J338 Other polyp of sinus: Secondary | ICD-10-CM | POA: Diagnosis not present

## 2017-11-11 DIAGNOSIS — R05 Cough: Secondary | ICD-10-CM | POA: Diagnosis not present

## 2017-11-24 DIAGNOSIS — Z6823 Body mass index (BMI) 23.0-23.9, adult: Secondary | ICD-10-CM | POA: Diagnosis not present

## 2017-11-24 DIAGNOSIS — Z13820 Encounter for screening for osteoporosis: Secondary | ICD-10-CM | POA: Diagnosis not present

## 2017-11-24 DIAGNOSIS — Z01419 Encounter for gynecological examination (general) (routine) without abnormal findings: Secondary | ICD-10-CM | POA: Diagnosis not present

## 2017-12-04 DIAGNOSIS — Z1231 Encounter for screening mammogram for malignant neoplasm of breast: Secondary | ICD-10-CM | POA: Diagnosis not present

## 2017-12-05 DIAGNOSIS — R635 Abnormal weight gain: Secondary | ICD-10-CM | POA: Diagnosis not present

## 2017-12-05 DIAGNOSIS — Z13228 Encounter for screening for other metabolic disorders: Secondary | ICD-10-CM | POA: Diagnosis not present

## 2017-12-05 DIAGNOSIS — E785 Hyperlipidemia, unspecified: Secondary | ICD-10-CM | POA: Diagnosis not present

## 2017-12-05 DIAGNOSIS — E559 Vitamin D deficiency, unspecified: Secondary | ICD-10-CM | POA: Diagnosis not present

## 2017-12-05 DIAGNOSIS — E039 Hypothyroidism, unspecified: Secondary | ICD-10-CM | POA: Diagnosis not present

## 2017-12-05 DIAGNOSIS — R5383 Other fatigue: Secondary | ICD-10-CM | POA: Diagnosis not present

## 2017-12-10 ENCOUNTER — Ambulatory Visit (HOSPITAL_COMMUNITY)
Admission: EM | Admit: 2017-12-10 | Discharge: 2017-12-10 | Disposition: A | Discharge status: Home / Self Care | Payer: BLUE CROSS/BLUE SHIELD | Attending: Internal Medicine | Admitting: Internal Medicine

## 2017-12-10 ENCOUNTER — Encounter (HOSPITAL_COMMUNITY): Payer: Self-pay | Admitting: Emergency Medicine

## 2017-12-10 DIAGNOSIS — M1990 Unspecified osteoarthritis, unspecified site: Secondary | ICD-10-CM | POA: Diagnosis not present

## 2017-12-10 DIAGNOSIS — J029 Acute pharyngitis, unspecified: Secondary | ICD-10-CM

## 2017-12-10 DIAGNOSIS — R509 Fever, unspecified: Secondary | ICD-10-CM | POA: Diagnosis not present

## 2017-12-10 DIAGNOSIS — B4481 Allergic bronchopulmonary aspergillosis: Secondary | ICD-10-CM | POA: Insufficient documentation

## 2017-12-10 DIAGNOSIS — R05 Cough: Secondary | ICD-10-CM

## 2017-12-10 DIAGNOSIS — R062 Wheezing: Secondary | ICD-10-CM

## 2017-12-10 DIAGNOSIS — B349 Viral infection, unspecified: Secondary | ICD-10-CM | POA: Insufficient documentation

## 2017-12-10 DIAGNOSIS — K219 Gastro-esophageal reflux disease without esophagitis: Secondary | ICD-10-CM | POA: Insufficient documentation

## 2017-12-10 DIAGNOSIS — J4541 Moderate persistent asthma with (acute) exacerbation: Secondary | ICD-10-CM | POA: Diagnosis not present

## 2017-12-10 DIAGNOSIS — E039 Hypothyroidism, unspecified: Secondary | ICD-10-CM | POA: Diagnosis not present

## 2017-12-10 DIAGNOSIS — Z79899 Other long term (current) drug therapy: Secondary | ICD-10-CM | POA: Diagnosis not present

## 2017-12-10 LAB — POCT RAPID STREP A: Streptococcus, Group A Screen (Direct): NEGATIVE

## 2017-12-10 MED ORDER — IPRATROPIUM-ALBUTEROL 0.5-2.5 (3) MG/3ML IN SOLN
3.0000 mL | Freq: Once | RESPIRATORY_TRACT | Status: AC
  Administered 2017-12-10: 3 mL via RESPIRATORY_TRACT

## 2017-12-10 MED ORDER — PSEUDOEPH-BROMPHEN-DM 30-2-10 MG/5ML PO SYRP: 5.0000 mL | ORAL_SOLUTION | Freq: Four times a day (QID) | ORAL | 0 refills | Status: DC | PRN

## 2017-12-10 MED ORDER — IPRATROPIUM-ALBUTEROL 0.5-2.5 (3) MG/3ML IN SOLN
RESPIRATORY_TRACT | Status: AC
  Filled 2017-12-10: qty 3

## 2017-12-10 MED ORDER — PREDNISONE 50 MG PO TABS: 50.0000 mg | ORAL_TABLET | Freq: Every day | ORAL | 0 refills | Status: AC

## 2017-12-10 MED ORDER — AMOXICILLIN-POT CLAVULANATE 875-125 MG PO TABS: 1.0000 | ORAL_TABLET | Freq: Two times a day (BID) | ORAL | 0 refills | Status: AC

## 2017-12-10 NOTE — Discharge Instructions (Addendum)
Please continue claritin, flonase  May use cough syrup which has a decongestant/sudafed in it.   Alternate Tylenol and Ibuprofen every 4 hours for fever control  May fill prescription for Augmentin if symptoms not improving in 4-5 days with continued symptoms management

## 2017-12-10 NOTE — ED Provider Notes (Signed)
[facility]    CSN: [id number] Arrival date & time: 12/10/17  9024     History   Chief Complaint Chief Complaint  Patient presents with  . Cough  . Fever    HPI LARSEN ZETTEL is a 57 y.o. female history of asthma, hypothyroidism, Patient is presenting with URI symptoms- congestion, cough, sore throat.  Also noting fevers up to 101.5, chills, worsening of asthma symptoms.  Patient's main complaints are fevers, worsening symptoms. Symptoms have been going on for 2 to 3 days. Patient has tried Tylenol, breathing treatment as well as asthma inhalers,  with minimal relief.  She is already taking daily Claritin, Flonase for her regular allergies over the past couple weeks.  Denies nausea, vomiting, diarrhea. Denies shortness of breath and chest pain.  Has seen ENT many times and had signed as surgery, but notes that she has recurrent sinusitis.   HPI  Past Medical History:  Diagnosis Date  . Arthritis   . Asthma    DX made  and treated per allergy MD  . Bunion   . GERD (gastroesophageal reflux disease)   . Hammer toe   . Hypothyroidism     Patient Active Problem List   Diagnosis Date Noted  . Asthma, well controlled, moderate persistent 10/24/2016  . Eosinophilia 07/12/2015  . 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 Severe persistent asthma 07/11/2015  . Allergic rhinitis 06/20/2013  . Sinusitis, chronic 06/20/2013  . GERD (gastroesophageal reflux disease)   . Cough   . Thyroid disease   . Arthritis     Past Surgical History:  Procedure Laterality Date  . BUNIONECTOMY WITH HAMMERTOE RECONSTRUCTION Bilateral   . CESAREAN SECTION     x2  . COLONOSCOPY    . GREAT TOE ARTHRODESIS, INTERPHALANGEAL JOINT    . NASAL SINUS SURGERY    . NECK SURGERY     plates and screws in  . POLYPECTOMY    . ROTATOR CUFF REPAIR Right    right  . TOTAL KNEE ARTHROPLASTY Right     OB History   None      Home Medications    Prior to  Admission medications   Medication Sig Start Date End Date Taking? Authorizing Provider  estradiol (VIVELLE-DOT) 0.05 MG/24HR patch Place 1 patch onto the skin once a week. 06/22/15  Yes [provider]  levothyroxine (SYNTHROID, LEVOTHROID) 125 MCG tablet Take 125 mcg by mouth daily before breakfast.   Yes [provider]  mometasone-formoterol (DULERA) 100-5 MCG/ACT AERO Inhale 2 puffs into the lungs 2 (two) times daily.   Yes [provider]  progesterone (PROMETRIUM) 100 MG capsule Take 1 capsule by mouth at bedtime. 06/22/15  Yes [provider]  albuterol (PROVENTIL) (2.5 MG/3ML) 0.083% nebulizer solution Take 2.5 mg by nebulization every 6 (six) hours as needed for wheezing or shortness of breath.    [provider]  amoxicillin-clavulanate (AUGMENTIN) 875-125 MG tablet Take 1 tablet by mouth every 12 (twelve) hours for 10 days. 12/10/17 12/20/17  Cletis Muma C, PA-C  brompheniramine-pseudoephedrine-DM 30-2-10 MG/5ML syrup Take 5 mLs by mouth 4 (four) times daily as needed. 12/10/17   Jameya Pontiff C, PA-C  fluticasone (FLONASE) 50 MCG/ACT nasal spray Place 2 sprays into the nose daily. 11/04/12   Debbe Odea, MD  meloxicam (MOBIC) 15 MG tablet Take 15 mg by mouth daily.    [provider]  montelukast (SINGULAIR) 10 MG tablet Take 10 mg by mouth at bedtime.  [provider]  omeprazole (PRILOSEC) 20 MG capsule Take 20 mg by mouth daily.    [provider]  predniSONE (DELTASONE) 50 MG tablet Take 1 tablet (50 mg total) by mouth daily for 5 days. 12/10/17 12/15/17  Nikholas Geffre C, PA-C  PROAIR HFA 108 (90 BASE) MCG/ACT inhaler Inhale 2 puffs into the lungs every 6 (six) hours as needed for wheezing or shortness of breath.  05/24/13   [provider]  sodium chloride (OCEAN) 0.65 % SOLN nasal spray Place 1 spray into both nostrils as needed for congestion. Nasal spray or Neti-pot    [provider]    valACYclovir (VALTREX) 1000 MG tablet as needed. 07/08/11   [provider]    Family History Family History  Problem Relation Age of Onset  . Emphysema Mother   . Melanoma Father   . Heart disease Maternal Uncle   . Melanoma Paternal Uncle   . Colon cancer Neg Hx   . Esophageal cancer Neg Hx   . Stomach cancer Neg Hx   . Rectal cancer Neg Hx     Social History Social History   Tobacco Use  . Smoking status: Never Smoker  . Smokeless tobacco: Never Used  Substance Use Topics  . Alcohol use: Yes    Alcohol/week: 2.4 oz    Types: 4 Glasses of wine per week    Comment: 4 days per wk  . Drug use: No     Allergies   Patient has no known allergies.   Review of Systems Review of Systems  Constitutional: Positive for fatigue and fever. Negative for activity change, appetite change and chills.  HENT: Positive for congestion, rhinorrhea, sinus pressure and sore throat. Negative for ear pain and trouble swallowing.   Eyes: Negative for discharge and redness.  Respiratory: Positive for cough and wheezing. Negative for chest tightness and shortness of breath.   Cardiovascular: Negative for chest pain.  Gastrointestinal: Negative for abdominal pain, diarrhea, nausea and vomiting.  Musculoskeletal: Positive for myalgias.  Skin: Negative for rash.  Neurological: Negative for dizziness, light-headedness and headaches.     Physical Exam Triage Vital Signs ED Triage Vitals [12/10/17 0832]  Enc Vitals Group     BP 133/86     Pulse Rate 92     Resp 18     Temp 98.9 F (37.2 C)     Temp Source Oral     SpO2 99 %     Weight      Height      Head Circumference      Peak Flow      Pain Score      Pain Loc      Pain Edu?      Excl. in [place]?    No data found.  Updated Vital Signs BP 133/86 (BP Location: Left Arm)   Pulse 92   Temp 98.9 F (37.2 C) (Oral)   Resp 18   SpO2 99%   Visual Acuity Right Eye Distance:   Left Eye Distance:   Bilateral Distance:     Right Eye Near:   Left Eye Near:    Bilateral Near:     Physical Exam  Constitutional: She appears well-developed and well-nourished. No distress.  HENT:  Head: Normocephalic and atraumatic.  Bilateral ears without tenderness to palpation of external auricle, tragus and mastoid, EAC's without erythema or swelling, TM's with good bony landmarks and cone of light. Non erythematous.  Oral mucosa pink and moist,  no tonsillar enlargement or exudate. Posterior pharynx patent and erythematous, no uvula deviation or swelling. Normal phonation.  Eyes: Conjunctivae are normal.  Neck: Neck supple.  Cardiovascular: Normal rate and regular rhythm.  No murmur heard. Pulmonary/Chest: Effort normal and breath sounds normal. No respiratory distress.  Mild expiratory wheezing throughout all lung fields; improved after breathing treatment  Abdominal: Soft. There is no tenderness.  Musculoskeletal: She exhibits no edema.  Neurological: She is alert.  Skin: Skin is warm and dry.  Psychiatric: She has a normal mood and affect.  Nursing note and vitals reviewed.    UC Treatments / Results  Labs (all labs ordered are listed, but only abnormal results are displayed) Labs Reviewed  CULTURE, GROUP A STREP [facility])  POCT RAPID STREP A    EKG None  Radiology No results found.  Procedures Procedures (including critical care time)  Medications Ordered in UC Medications  ipratropium-albuterol (DUONEB) 0.5-2.5 (3) MG/3ML nebulizer solution 3 mL (3 mLs Nebulization Given 12/10/17 0925)    Initial Impression / Assessment and Plan / UC Course  I have reviewed the triage vital signs and the nursing notes.  Pertinent labs & imaging results that were available during my care of the patient were reviewed by me and considered in my medical decision making (see chart for details).     Strep test negative, symptoms likely viral URI with asthma exacerbation.  Will recommend to get symptoms continue  symptomatic management.  Will provide cough syrup with Sudafed.  Prednisone.  Provided printed prescription for Augmentin to fill in 4 to 5 days if symptoms not improving, given patient has many sinus issues.Discussed strict return precautions. Patient verbalized understanding and is agreeable with plan.  No fever in clinic today, but did have Tylenol prior to arrival. Final Clinical Impressions(s) / UC Diagnoses   Final diagnoses:  Viral illness  Moderate persistent asthma with acute exacerbation     Discharge Instructions     Please continue claritin, flonase  May use cough syrup which has a decongestant/sudafed in it.   Alternate Tylenol and Ibuprofen every 4 hours for fever control  May fill prescription for Augmentin if symptoms not improving in 4-5 days with continued symptoms management   ED Prescriptions    Medication Sig Dispense Auth. Provider   amoxicillin-clavulanate (AUGMENTIN) 875-125 MG tablet Take 1 tablet by mouth every 12 (twelve) hours for 10 days. 20 tablet Na Waldrip C, PA-C   brompheniramine-pseudoephedrine-DM 30-2-10 MG/5ML syrup Take 5 mLs by mouth 4 (four) times daily as needed. 120 mL Breckyn Troyer C, PA-C   predniSONE (DELTASONE) 50 MG tablet Take 1 tablet (50 mg total) by mouth daily for 5 days. 5 tablet Izaan Kingbird C, PA-C     Controlled Substance Prescriptions [place] Controlled Substance Registry consulted? Not Applicable   Janith Lima, [place] 12/10/17 1346

## 2017-12-10 NOTE — ED Triage Notes (Signed)
Pt c/o cough, sinus issues, congestion, since Monday. Fever of 102 at home. Taking tylenol at home.

## 2017-12-12 LAB — CULTURE, GROUP A STREP (THRC)

## 2017-12-22 DIAGNOSIS — J338 Other polyp of sinus: Secondary | ICD-10-CM | POA: Diagnosis not present

## 2017-12-22 DIAGNOSIS — J324 Chronic pansinusitis: Secondary | ICD-10-CM | POA: Diagnosis not present

## 2017-12-22 DIAGNOSIS — J4551 Severe persistent asthma with (acute) exacerbation: Secondary | ICD-10-CM | POA: Diagnosis not present

## 2017-12-22 DIAGNOSIS — J329 Chronic sinusitis, unspecified: Secondary | ICD-10-CM | POA: Diagnosis not present

## 2018-01-01 DIAGNOSIS — J4541 Moderate persistent asthma with (acute) exacerbation: Secondary | ICD-10-CM | POA: Diagnosis not present

## 2018-01-23 DIAGNOSIS — M25562 Pain in left knee: Secondary | ICD-10-CM | POA: Diagnosis not present

## 2018-01-23 DIAGNOSIS — M7062 Trochanteric bursitis, left hip: Secondary | ICD-10-CM | POA: Diagnosis not present

## 2018-02-01 DIAGNOSIS — Z051 Observation and evaluation of newborn for suspected infectious condition ruled out: Secondary | ICD-10-CM | POA: Diagnosis not present

## 2018-02-01 DIAGNOSIS — Z23 Encounter for immunization: Secondary | ICD-10-CM | POA: Diagnosis not present

## 2018-02-13 ENCOUNTER — Telehealth: Payer: Self-pay | Admitting: Internal Medicine

## 2018-02-13 NOTE — Telephone Encounter (Signed)
Will route to TS for update.

## 2018-02-16 NOTE — Telephone Encounter (Addendum)
Pt has moved her records to [facility] She is no longer our pt. I call [facility] to make them aware. Nothing further needed.

## 2018-02-20 DIAGNOSIS — L22 Diaper dermatitis: Secondary | ICD-10-CM | POA: Diagnosis not present

## 2018-02-20 DIAGNOSIS — J4540 Moderate persistent asthma, uncomplicated: Secondary | ICD-10-CM | POA: Diagnosis not present

## 2018-03-02 DIAGNOSIS — B078 Other viral warts: Secondary | ICD-10-CM | POA: Diagnosis not present

## 2018-03-02 DIAGNOSIS — D2271 Melanocytic nevi of right lower limb, including hip: Secondary | ICD-10-CM | POA: Diagnosis not present

## 2018-03-02 DIAGNOSIS — C44519 Basal cell carcinoma of skin of other part of trunk: Secondary | ICD-10-CM | POA: Diagnosis not present

## 2018-03-02 DIAGNOSIS — Z85828 Personal history of other malignant neoplasm of skin: Secondary | ICD-10-CM | POA: Diagnosis not present

## 2018-03-02 DIAGNOSIS — D225 Melanocytic nevi of trunk: Secondary | ICD-10-CM | POA: Diagnosis not present

## 2018-03-02 DIAGNOSIS — D2272 Melanocytic nevi of left lower limb, including hip: Secondary | ICD-10-CM | POA: Diagnosis not present

## 2018-03-02 DIAGNOSIS — C44319 Basal cell carcinoma of skin of other parts of face: Secondary | ICD-10-CM | POA: Diagnosis not present

## 2018-03-02 DIAGNOSIS — L570 Actinic keratosis: Secondary | ICD-10-CM | POA: Diagnosis not present

## 2018-03-25 DIAGNOSIS — J4540 Moderate persistent asthma, uncomplicated: Secondary | ICD-10-CM | POA: Diagnosis not present

## 2018-03-25 DIAGNOSIS — J3089 Other allergic rhinitis: Secondary | ICD-10-CM | POA: Diagnosis not present

## 2018-04-27 DIAGNOSIS — J329 Chronic sinusitis, unspecified: Secondary | ICD-10-CM | POA: Diagnosis not present

## 2018-04-27 DIAGNOSIS — J324 Chronic pansinusitis: Secondary | ICD-10-CM | POA: Diagnosis not present

## 2018-04-27 DIAGNOSIS — D721 Eosinophilia: Secondary | ICD-10-CM | POA: Diagnosis not present

## 2018-04-27 DIAGNOSIS — J339 Nasal polyp, unspecified: Secondary | ICD-10-CM | POA: Diagnosis not present

## 2018-05-11 DIAGNOSIS — C44319 Basal cell carcinoma of skin of other parts of face: Secondary | ICD-10-CM | POA: Diagnosis not present

## 2018-07-01 DIAGNOSIS — L570 Actinic keratosis: Secondary | ICD-10-CM | POA: Diagnosis not present

## 2018-07-01 DIAGNOSIS — Z85828 Personal history of other malignant neoplasm of skin: Secondary | ICD-10-CM | POA: Diagnosis not present

## 2018-07-01 DIAGNOSIS — D225 Melanocytic nevi of trunk: Secondary | ICD-10-CM | POA: Diagnosis not present

## 2018-07-01 DIAGNOSIS — D0461 Carcinoma in situ of skin of right upper limb, including shoulder: Secondary | ICD-10-CM | POA: Diagnosis not present

## 2018-07-01 DIAGNOSIS — L821 Other seborrheic keratosis: Secondary | ICD-10-CM | POA: Diagnosis not present

## 2018-07-09 DIAGNOSIS — J029 Acute pharyngitis, unspecified: Secondary | ICD-10-CM | POA: Diagnosis not present

## 2018-07-09 DIAGNOSIS — R0989 Other specified symptoms and signs involving the circulatory and respiratory systems: Secondary | ICD-10-CM | POA: Diagnosis not present

## 2018-07-09 DIAGNOSIS — J329 Chronic sinusitis, unspecified: Secondary | ICD-10-CM | POA: Diagnosis not present

## 2018-07-09 DIAGNOSIS — R05 Cough: Secondary | ICD-10-CM | POA: Diagnosis not present

## 2018-07-09 DIAGNOSIS — J45998 Other asthma: Secondary | ICD-10-CM | POA: Diagnosis not present

## 2018-07-13 DIAGNOSIS — J209 Acute bronchitis, unspecified: Secondary | ICD-10-CM | POA: Diagnosis not present

## 2018-07-13 DIAGNOSIS — J4541 Moderate persistent asthma with (acute) exacerbation: Secondary | ICD-10-CM | POA: Diagnosis not present

## 2018-07-17 ENCOUNTER — Other Ambulatory Visit: Payer: Self-pay | Admitting: Allergy and Immunology

## 2018-07-17 ENCOUNTER — Ambulatory Visit
Admission: RE | Admit: 2018-07-17 | Discharge: 2018-07-17 | Disposition: A | Discharge status: Home / Self Care | Payer: BLUE CROSS/BLUE SHIELD | Source: Ambulatory Visit | Attending: Allergy and Immunology | Admitting: Allergy and Immunology

## 2018-07-17 DIAGNOSIS — J157 Pneumonia due to Mycoplasma pneumoniae: Secondary | ICD-10-CM

## 2018-07-17 DIAGNOSIS — J3089 Other allergic rhinitis: Secondary | ICD-10-CM | POA: Diagnosis not present

## 2018-07-17 DIAGNOSIS — J4540 Moderate persistent asthma, uncomplicated: Secondary | ICD-10-CM | POA: Diagnosis not present

## 2018-07-17 DIAGNOSIS — R05 Cough: Secondary | ICD-10-CM | POA: Diagnosis not present

## 2018-10-07 DIAGNOSIS — L570 Actinic keratosis: Secondary | ICD-10-CM | POA: Diagnosis not present

## 2018-10-07 DIAGNOSIS — L821 Other seborrheic keratosis: Secondary | ICD-10-CM | POA: Diagnosis not present

## 2018-10-07 DIAGNOSIS — Z85828 Personal history of other malignant neoplasm of skin: Secondary | ICD-10-CM | POA: Diagnosis not present

## 2018-10-07 DIAGNOSIS — L820 Inflamed seborrheic keratosis: Secondary | ICD-10-CM | POA: Diagnosis not present

## 2018-10-07 DIAGNOSIS — C44612 Basal cell carcinoma of skin of right upper limb, including shoulder: Secondary | ICD-10-CM | POA: Diagnosis not present

## 2018-10-13 ENCOUNTER — Encounter: Payer: Self-pay | Admitting: Internal Medicine

## 2018-10-15 ENCOUNTER — Ambulatory Visit: Payer: BLUE CROSS/BLUE SHIELD | Admitting: *Deleted

## 2018-10-15 ENCOUNTER — Other Ambulatory Visit: Payer: Self-pay

## 2018-10-15 ENCOUNTER — Encounter: Payer: Self-pay | Admitting: Internal Medicine

## 2018-10-15 VITALS — Ht 67.5 in | Wt 142.0 lb

## 2018-10-15 DIAGNOSIS — Z86010 Personal history of colonic polyps: Secondary | ICD-10-CM

## 2018-10-15 MED ORDER — NA SULFATE-K SULFATE-MG SULF 17.5-3.13-1.6 GM/177ML PO SOLN: ORAL | 0 refills | Status: DC

## 2018-10-15 NOTE — Progress Notes (Signed)
Patient's pre-visit was done today over the phone with the patient. Name,DOB and address verified. Insurance verified. Packet of Prep instructions mailed to patient including copy of a consent form and pre-procedure patient acknowledgement form,Suprep $15 coupon-pt is aware. Patient understands to call us back with any questions or concerns.   Patient denies any allergies to eggs or soy. Patient denies any problems with anesthesia/sedation. Patient denies any oxygen use at home. Patient denies taking any diet/weight loss medications or blood thinners. EMMI education assisgned to patient on colonoscopy, this was explained and instructions given to patient.

## 2018-10-26 ENCOUNTER — Telehealth: Payer: Self-pay

## 2018-10-26 NOTE — Telephone Encounter (Signed)
Covid-19 screening questions  Have you traveled in the last 14 days? No. If yes where?  Do you now or have you had a fever in the last 14 days? No.  Do you have any respiratory symptoms of shortness of breath or cough now or in the last 14 days? No.  Do you have any family members or close contacts with diagnosed or suspected Covid-19 in the past 14 days? No.  Have you been tested for Covid-19 and found to be positive? No.       

## 2018-10-28 ENCOUNTER — Encounter: Payer: Self-pay | Admitting: Internal Medicine

## 2018-10-28 ENCOUNTER — Ambulatory Visit (AMBULATORY_SURGERY_CENTER): Payer: BC Managed Care – PPO | Admitting: Internal Medicine

## 2018-10-28 ENCOUNTER — Other Ambulatory Visit: Payer: Self-pay

## 2018-10-28 VITALS — BP 109/62 | HR 55 | Temp 98.8°F | Resp 12 | Ht 67.0 in

## 2018-10-28 DIAGNOSIS — Z86010 Personal history of colonic polyps: Secondary | ICD-10-CM | POA: Diagnosis not present

## 2018-10-28 DIAGNOSIS — D120 Benign neoplasm of cecum: Secondary | ICD-10-CM | POA: Diagnosis not present

## 2018-10-28 DIAGNOSIS — Z1211 Encounter for screening for malignant neoplasm of colon: Secondary | ICD-10-CM | POA: Diagnosis not present

## 2018-10-28 MED ORDER — SODIUM CHLORIDE 0.9 % IV SOLN: 500.0000 mL | Freq: Once | INTRAVENOUS | Status: DC

## 2018-10-28 NOTE — Progress Notes (Signed)
Called to room to assist during endoscopic procedure.  Patient ID and intended procedure confirmed with present staff. Received instructions for my participation in the procedure from the performing physician.  

## 2018-10-28 NOTE — Progress Notes (Signed)
Pt's states no medical or surgical changes since previsit or office visit. 

## 2018-10-28 NOTE — Op Note (Signed)
[facility] Patient Name: [name] Procedure Date: 10/28/2018 8:26 AM MRN: [id number] Endoscopist: Docia Chuck. Henrene Pastor , MD Age: 58 Referring MD:  Date of Birth: 04/22/1961 Gender: Female Account #: [id number] Procedure:                Colonoscopy with cold snare polypectomy x 1. Indications:              High risk colon cancer surveillance: Personal                            history of non-advanced adenoma. Previous                            examinations with Dr. Olevia Perches 2007 and 2013. Medicines:                Monitored Anesthesia Care Procedure:                Pre-Anesthesia Assessment:                           - Prior to the procedure, a History and Physical                            was performed, and patient medications and                            allergies were reviewed. The patient's tolerance of                            previous anesthesia was also reviewed. The risks                            and benefits of the procedure and the sedation                            options and risks were discussed with the patient.                            All questions were answered, and informed consent                            was obtained. Prior Anticoagulants: The patient has                            taken no previous anticoagulant or antiplatelet                            agents. ASA Grade Assessment: II - A patient with                            mild systemic disease. After reviewing the risks                            and benefits, the patient was deemed in  satisfactory condition to undergo the procedure.                           After obtaining informed consent, the colonoscope                            was passed under direct vision. Throughout the                            procedure, the patient's blood pressure, pulse, and                            oxygen saturations were monitored continuously. The    Model CF-HQ190L [id number]) scope was introduced                            through the anus and advanced to the the cecum,                            identified by appendiceal orifice and ileocecal                            valve. The ileocecal valve, appendiceal orifice,                            and rectum were photographed. The quality of the                            bowel preparation was good. The colonoscopy was                            performed without difficulty. The patient tolerated                            the procedure well. The bowel preparation used was                            SUPREP via split dose instruction. Scope In: 8:47:59 AM Scope Out: 9:06:06 AM Scope Withdrawal Time: 0 hours 12 minutes 25 seconds  Total Procedure Duration: 0 hours 18 minutes 7 seconds  Findings:                 A 2 mm polyp was found in the cecum. The polyp was                            removed with a cold snare. Resection and retrieval                            were complete.                           The exam was otherwise without abnormality on  direct and retroflexion views. The terminal ileum                            was normal. Complications:            No immediate complications. Estimated blood loss:                            None. Estimated Blood Loss:     Estimated blood loss: none. Impression:               - One 2 mm polyp in the cecum, removed with a cold                            snare. Resected and retrieved.                           - The examination was otherwise normal on direct                            and retroflexion views. The terminal ileum was                            normal. Recommendation:           - Repeat colonoscopy in 7 years for surveillance.                           - Patient has a contact number available for                            emergencies. The signs and symptoms of potential                             delayed complications were discussed with the                            patient. Return to normal activities tomorrow.                            Written discharge instructions were provided to the                            patient.                           - Resume previous diet.                           - Continue present medications.                           - Await pathology results. Docia Chuck. Henrene Pastor, MD 10/28/2018 9:12:40 AM This report has been signed electronically.

## 2018-10-28 NOTE — Progress Notes (Signed)
A and O x3. Report to RN. Tolerated MAC anesthesia well.

## 2018-10-28 NOTE — Patient Instructions (Signed)
YOU HAD AN ENDOSCOPIC PROCEDURE TODAY AT THE [facility]:   Refer to the procedure report that was given to you for any specific questions about what was found during the examination.  If the procedure report does not answer your questions, please call your gastroenterologist to clarify.  If you requested that your care partner not be given the details of your procedure findings, then the procedure report has been included in a sealed envelope for you to review at your convenience later.  **Handout given on polyps**   YOU SHOULD EXPECT: Some feelings of bloating in the abdomen. Passage of more gas than usual.  Walking can help get rid of the air that was put into your GI tract during the procedure and reduce the bloating. If you had a lower endoscopy (such as a colonoscopy or flexible sigmoidoscopy) you may notice spotting of blood in your stool or on the toilet paper. If you underwent a bowel prep for your procedure, you may not have a normal bowel movement for a few days.  Please Note:  You might notice some irritation and congestion in your nose or some drainage.  This is from the oxygen used during your procedure.  There is no need for concern and it should clear up in a day or so.  SYMPTOMS TO REPORT IMMEDIATELY:   Following lower endoscopy (colonoscopy or flexible sigmoidoscopy):  Excessive amounts of blood in the stool  Significant tenderness or worsening of abdominal pains  Swelling of the abdomen that is new, acute  Fever of 100F or higher   For urgent or emergent issues, a gastroenterologist can be reached at any hour by calling [phone].   DIET:  We do recommend a small meal at first, but then you may proceed to your regular diet.  Drink plenty of fluids but you should avoid alcoholic beverages for 24 hours.  ACTIVITY:  You should plan to take it easy for the rest of today and you should NOT DRIVE or use heavy machinery until tomorrow (because of the sedation  medicines used during the test).    FOLLOW UP: Our staff will call the number listed on your records 48-72 hours following your procedure to check on you and address any questions or concerns that you may have regarding the information given to you following your procedure. If we do not reach you, we will leave a message.  We will attempt to reach you two times.  During this call, we will ask if you have developed any symptoms of COVID 19. If you develop any symptoms (ie: fever, flu-like symptoms, shortness of breath, cough etc.) before then, please call [phone].  If you test positive for Covid 19 in the 2 weeks post procedure, please call and report this information to us.    If any biopsies were taken you will be contacted by phone or by letter within the next 1-3 weeks.  Please call us at [phone] if you have not heard about the biopsies in 3 weeks.    SIGNATURES/CONFIDENTIALITY: You and/or your care partner have signed paperwork which will be entered into your electronic medical record.  These signatures attest to the fact that that the information above on your After Visit Summary has been reviewed and is understood.  Full responsibility of the confidentiality of this discharge information lies with you and/or your care-partner. 

## 2018-10-30 ENCOUNTER — Telehealth: Payer: Self-pay

## 2018-10-30 ENCOUNTER — Telehealth: Payer: Self-pay | Admitting: *Deleted

## 2018-10-30 NOTE — Telephone Encounter (Signed)
No answer. Number identifier. Message left to call if questions or concerns and we would attempt to call again later in the day.

## 2018-10-30 NOTE — Telephone Encounter (Signed)
Attempted to reach patient for post-procedure f/u call. No answer. Left message for her to please not hesitate to call us if she has any questions/concerns regarding her care. 

## 2018-11-01 ENCOUNTER — Encounter: Payer: Self-pay | Admitting: Internal Medicine

## 2018-11-02 DIAGNOSIS — J324 Chronic pansinusitis: Secondary | ICD-10-CM | POA: Diagnosis not present

## 2018-11-02 DIAGNOSIS — J339 Nasal polyp, unspecified: Secondary | ICD-10-CM | POA: Diagnosis not present

## 2018-11-02 DIAGNOSIS — D721 Eosinophilia: Secondary | ICD-10-CM | POA: Diagnosis not present

## 2018-11-18 DIAGNOSIS — J3089 Other allergic rhinitis: Secondary | ICD-10-CM | POA: Diagnosis not present

## 2018-11-18 DIAGNOSIS — J4540 Moderate persistent asthma, uncomplicated: Secondary | ICD-10-CM | POA: Diagnosis not present

## 2018-11-19 DIAGNOSIS — E039 Hypothyroidism, unspecified: Secondary | ICD-10-CM | POA: Diagnosis not present

## 2018-11-19 DIAGNOSIS — Z0000 Encounter for general adult medical examination without abnormal findings: Secondary | ICD-10-CM | POA: Diagnosis not present

## 2018-11-19 DIAGNOSIS — R1032 Left lower quadrant pain: Secondary | ICD-10-CM | POA: Diagnosis not present

## 2018-11-19 DIAGNOSIS — C44529 Squamous cell carcinoma of skin of other part of trunk: Secondary | ICD-10-CM | POA: Diagnosis not present

## 2018-11-19 DIAGNOSIS — R350 Frequency of micturition: Secondary | ICD-10-CM | POA: Diagnosis not present

## 2018-11-19 DIAGNOSIS — Z8249 Family history of ischemic heart disease and other diseases of the circulatory system: Secondary | ICD-10-CM | POA: Diagnosis not present

## 2018-11-24 DIAGNOSIS — Z03818 Encounter for observation for suspected exposure to other biological agents ruled out: Secondary | ICD-10-CM | POA: Diagnosis not present

## 2018-12-07 DIAGNOSIS — Z01419 Encounter for gynecological examination (general) (routine) without abnormal findings: Secondary | ICD-10-CM | POA: Diagnosis not present

## 2018-12-07 DIAGNOSIS — Z6823 Body mass index (BMI) 23.0-23.9, adult: Secondary | ICD-10-CM | POA: Diagnosis not present

## 2018-12-07 DIAGNOSIS — Z1231 Encounter for screening mammogram for malignant neoplasm of breast: Secondary | ICD-10-CM | POA: Diagnosis not present

## 2018-12-31 DIAGNOSIS — D225 Melanocytic nevi of trunk: Secondary | ICD-10-CM | POA: Diagnosis not present

## 2018-12-31 DIAGNOSIS — L821 Other seborrheic keratosis: Secondary | ICD-10-CM | POA: Diagnosis not present

## 2018-12-31 DIAGNOSIS — L814 Other melanin hyperpigmentation: Secondary | ICD-10-CM | POA: Diagnosis not present

## 2018-12-31 DIAGNOSIS — L570 Actinic keratosis: Secondary | ICD-10-CM | POA: Diagnosis not present

## 2018-12-31 DIAGNOSIS — Z85828 Personal history of other malignant neoplasm of skin: Secondary | ICD-10-CM | POA: Diagnosis not present

## 2019-01-05 DIAGNOSIS — R102 Pelvic and perineal pain: Secondary | ICD-10-CM | POA: Diagnosis not present

## 2019-03-25 DIAGNOSIS — M25561 Pain in right knee: Secondary | ICD-10-CM | POA: Diagnosis not present

## 2019-03-25 DIAGNOSIS — M7062 Trochanteric bursitis, left hip: Secondary | ICD-10-CM | POA: Diagnosis not present

## 2019-03-25 DIAGNOSIS — M1712 Unilateral primary osteoarthritis, left knee: Secondary | ICD-10-CM | POA: Diagnosis not present

## 2019-03-26 ENCOUNTER — Other Ambulatory Visit (HOSPITAL_COMMUNITY): Payer: Self-pay | Admitting: Orthopedic Surgery

## 2019-03-26 ENCOUNTER — Other Ambulatory Visit: Payer: Self-pay | Admitting: Orthopedic Surgery

## 2019-03-26 DIAGNOSIS — Z96651 Presence of right artificial knee joint: Secondary | ICD-10-CM

## 2019-04-06 ENCOUNTER — Other Ambulatory Visit: Payer: Self-pay

## 2019-04-06 ENCOUNTER — Encounter (HOSPITAL_COMMUNITY)
Admission: RE | Admit: 2019-04-06 | Discharge: 2019-04-06 | Disposition: A | Discharge status: Home / Self Care | Payer: BC Managed Care – PPO | Source: Ambulatory Visit | Attending: Orthopedic Surgery | Admitting: Orthopedic Surgery

## 2019-04-06 DIAGNOSIS — M25561 Pain in right knee: Secondary | ICD-10-CM | POA: Diagnosis not present

## 2019-04-06 DIAGNOSIS — Z96651 Presence of right artificial knee joint: Secondary | ICD-10-CM | POA: Diagnosis not present

## 2019-04-06 MED ORDER — TECHNETIUM TC 99M MEDRONATE IV KIT
19.5000 | PACK | Freq: Once | INTRAVENOUS | Status: AC | PRN
  Administered 2019-04-06: 19.5 via INTRAVENOUS

## 2019-06-01 ENCOUNTER — Ambulatory Visit: Payer: BC Managed Care – PPO | Attending: Internal Medicine

## 2019-06-01 DIAGNOSIS — Z20822 Contact with and (suspected) exposure to covid-19: Secondary | ICD-10-CM

## 2019-06-03 LAB — NOVEL CORONAVIRUS, NAA: SARS-CoV-2, NAA: NOT DETECTED

## 2019-07-28 ENCOUNTER — Other Ambulatory Visit: Payer: Self-pay | Admitting: Physician Assistant

## 2019-07-28 DIAGNOSIS — J45909 Unspecified asthma, uncomplicated: Secondary | ICD-10-CM

## 2019-07-28 DIAGNOSIS — U071 COVID-19: Secondary | ICD-10-CM

## 2019-07-28 NOTE — Progress Notes (Signed)
  I connected by phone with Houston Siren on 07/28/2019 at 4:40 PM to discuss the potential use of an new treatment for mild to moderate COVID-19 viral infection in non-hospitalized patients.  This patient is a 59 y.o. female that meets the FDA criteria for Emergency Use Authorization of bamlanivimab or casirivimab\imdevimab.  Has a (+) direct SARS-CoV-2 viral test result  Has mild or moderate COVID-19   Is ? 61 years of age and weighs ? 40 kg  Is NOT hospitalized due to COVID-19  Is NOT requiring oxygen therapy or requiring an increase in baseline oxygen flow rate due to COVID-19  Is within 10 days of symptom onset  Has at least one of the high risk factor(s) for progression to severe COVID-19 and/or hospitalization as defined in EUA.  Specific high risk criteria : asthma   I have spoken and communicated the following to the patient or parent/caregiver:  1. FDA has authorized the emergency use of bamlanivimab and casirivimab\imdevimab for the treatment of mild to moderate COVID-19 in adults and pediatric patients with positive results of direct SARS-CoV-2 viral testing who are 35 years of age and older weighing at least 40 kg, and who are at high risk for progressing to severe COVID-19 and/or hospitalization.  2. The significant known and potential risks and benefits of bamlanivimab and casirivimab\imdevimab, and the extent to which such potential risks and benefits are unknown.  3. Information on available alternative treatments and the risks and benefits of those alternatives, including clinical trials.  4. Patients treated with bamlanivimab and casirivimab\imdevimab should continue to self-isolate and use infection control measures (e.g., wear mask, isolate, social distance, avoid sharing personal items, clean and disinfect "high touch" surfaces, and frequent handwashing) according to CDC guidelines.   5. The patient or parent/caregiver has the option to accept or refuse  bamlanivimab or casirivimab\imdevimab .  After reviewing this information with the patient, The patient agreed to proceed with receiving the bamlanimivab infusion and will be provided a copy of the Fact sheet prior to receiving the infusion.   Sx onset 3/1. Set up for 07/30/19 @ 12:30pm. Directions given.   Angelena Form 07/28/2019 4:40 PM

## 2019-07-28 NOTE — Progress Notes (Signed)
Set up for home covid monitoring program

## 2019-07-29 ENCOUNTER — Ambulatory Visit (HOSPITAL_COMMUNITY): Payer: Self-pay

## 2019-07-29 MED ORDER — SODIUM CHLORIDE 0.9 % IV SOLN
700.0000 mg | Freq: Once | INTRAVENOUS | Status: AC
  Administered 2019-07-30: 700 mg via INTRAVENOUS
  Filled 2019-07-29: qty 20

## 2019-07-30 ENCOUNTER — Ambulatory Visit (HOSPITAL_COMMUNITY)
Admission: RE | Admit: 2019-07-30 | Discharge: 2019-07-30 | Disposition: A | Discharge status: Home / Self Care | Payer: HRSA Program | Source: Ambulatory Visit | Attending: Pulmonary Disease | Admitting: Pulmonary Disease

## 2019-07-30 ENCOUNTER — Encounter (HOSPITAL_COMMUNITY): Payer: Self-pay

## 2019-07-30 DIAGNOSIS — U071 COVID-19: Secondary | ICD-10-CM | POA: Insufficient documentation

## 2019-07-30 DIAGNOSIS — J45909 Unspecified asthma, uncomplicated: Secondary | ICD-10-CM

## 2019-07-30 MED ORDER — METHYLPREDNISOLONE SODIUM SUCC 125 MG IJ SOLR: 125.0000 mg | Freq: Once | INTRAMUSCULAR | Status: DC | PRN

## 2019-07-30 MED ORDER — SODIUM CHLORIDE 0.9 % IV SOLN: INTRAVENOUS | Status: DC | PRN

## 2019-07-30 MED ORDER — DIPHENHYDRAMINE HCL 50 MG/ML IJ SOLN: 50.0000 mg | Freq: Once | INTRAMUSCULAR | Status: DC | PRN

## 2019-07-30 MED ORDER — FAMOTIDINE IN NACL 20-0.9 MG/50ML-% IV SOLN: 20.0000 mg | Freq: Once | INTRAVENOUS | Status: DC | PRN

## 2019-07-30 MED ORDER — ALBUTEROL SULFATE HFA 108 (90 BASE) MCG/ACT IN AERS: 2.0000 | INHALATION_SPRAY | Freq: Once | RESPIRATORY_TRACT | Status: DC | PRN

## 2019-07-30 MED ORDER — EPINEPHRINE 0.3 MG/0.3ML IJ SOAJ: 0.3000 mg | Freq: Once | INTRAMUSCULAR | Status: DC | PRN

## 2019-07-30 NOTE — Progress Notes (Signed)
  Diagnosis: COVID-19  Physician: Dr. Joya Gaskins  Procedure: Covid Infusion Clinic Med: bamlanivimab infusion - Provided patient with bamlanimivab fact sheet for patients, parents and caregivers prior to infusion.  Complications: No immediate complications noted.  Discharge: Discharged home   Acquanetta Chain 07/30/2019

## 2019-07-30 NOTE — Discharge Instructions (Signed)

## 2019-08-09 ENCOUNTER — Other Ambulatory Visit: Payer: Self-pay | Admitting: Physician Assistant

## 2019-08-09 DIAGNOSIS — U071 COVID-19: Secondary | ICD-10-CM

## 2020-03-07 ENCOUNTER — Other Ambulatory Visit: Payer: Self-pay | Admitting: Obstetrics and Gynecology

## 2020-03-07 DIAGNOSIS — R928 Other abnormal and inconclusive findings on diagnostic imaging of breast: Secondary | ICD-10-CM

## 2020-03-23 ENCOUNTER — Ambulatory Visit: Payer: Self-pay

## 2020-03-23 ENCOUNTER — Other Ambulatory Visit: Payer: Self-pay

## 2020-03-23 ENCOUNTER — Ambulatory Visit
Admission: RE | Admit: 2020-03-23 | Discharge: 2020-03-23 | Disposition: A | Discharge status: Home / Self Care | Payer: Self-pay | Source: Ambulatory Visit | Attending: Obstetrics and Gynecology | Admitting: Obstetrics and Gynecology

## 2020-03-23 ENCOUNTER — Other Ambulatory Visit: Payer: Self-pay | Admitting: Obstetrics and Gynecology

## 2020-03-23 DIAGNOSIS — R928 Other abnormal and inconclusive findings on diagnostic imaging of breast: Secondary | ICD-10-CM

## 2020-07-18 IMAGING — NM NM BONE 3 PHASE
10 series · 20 of 20 positions shown · non-contrast
Comparison: None

Radiographic correlation: None

CLINICAL DATA: RIGHT knee pain, prior RIGHT total knee arthroplasty
8 years ago

EXAM:
NUCLEAR MEDICINE 3-PHASE BONE SCAN
TECHNIQUE: Radionuclide angiographic images, immediate static blood pool
images, and 3-hour delayed static images were obtained of the knees
after intravenous injection of radiopharmaceutical.
RADIOPHARMACEUTICALS:  19.5 mCi Uc-QQm MDP IV

[Series 1: flow · 2.07mm/px · 6 of 48 frames shown (1 of 2)]
[frame 5/48]
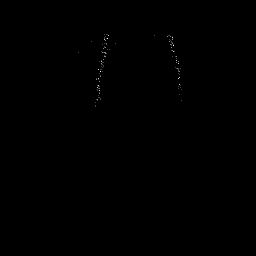
[frame 13/48  full-range]
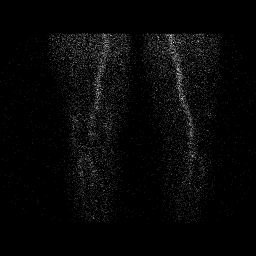
[frame 21/48  full-range]
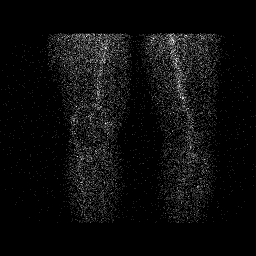
[frame 29/48  full-range]
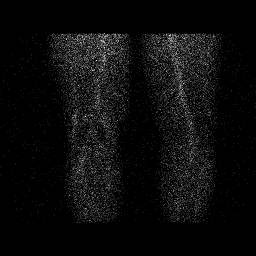
[frame 37/48  full-range]
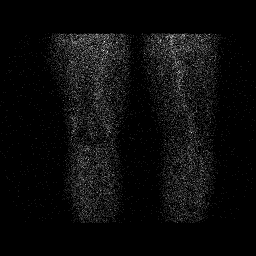
[frame 45/48  full-range]
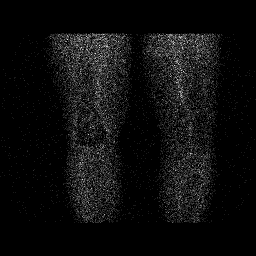

[Series 1: flow · 2.07mm/px · 6 of 48 frames shown (2 of 2)]
[frame 5/48]
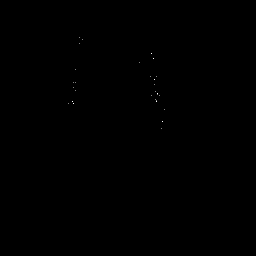
[frame 13/48  full-range]
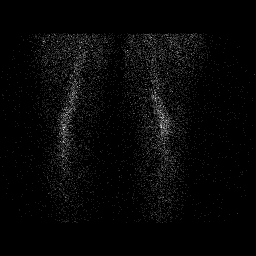
[frame 21/48  full-range]
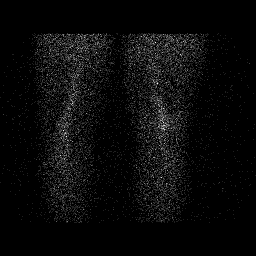
[frame 29/48  full-range]
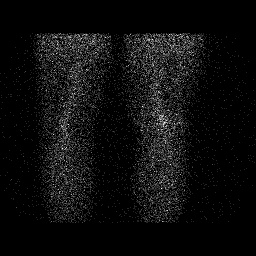
[frame 37/48  full-range]
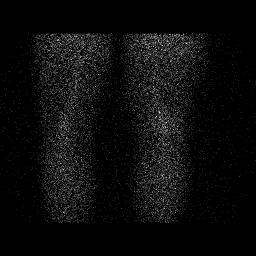
[frame 45/48  full-range]
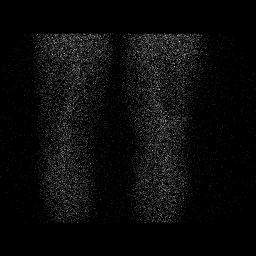

[Series 2: blood pool · 2.07mm/px · 1 of 1 slices shown (1 of 2)]
[im 1/1  full-range]
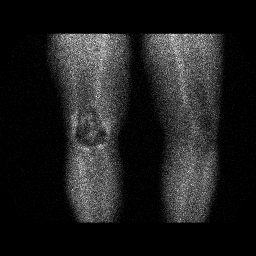

[Series 2: blood pool · 2.07mm/px · 1 of 1 slices shown (2 of 2)]
[im 1/1  full-range]
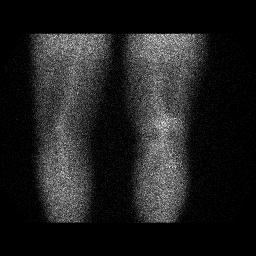

[Series 3: lat bp · 2.07mm/px · 1 of 1 slices shown (1 of 2)]
[im 1/1  full-range]
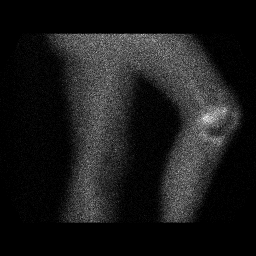

[Series 3: lat bp · 2.07mm/px · 1 of 1 slices shown (2 of 2)]
[im 1/1  full-range]
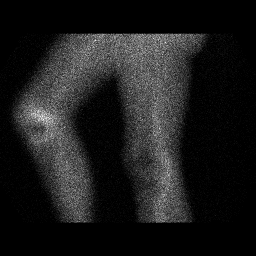

[Series 4: delay · delayed · 2.07mm/px · 1 of 1 slices shown (1 of 4)]
[im 1/1  full-range]
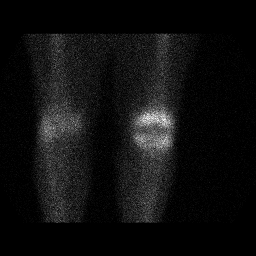

[Series 4: delay · delayed · 2.07mm/px · 1 of 1 slices shown (2 of 4)]
[im 1/1]
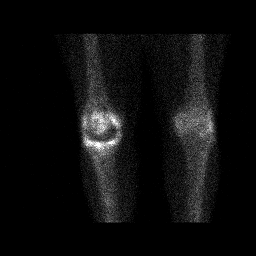

[Series 5: delay · delayed · 2.07mm/px · 1 of 1 slices shown (3 of 4)]
[im 1/1]
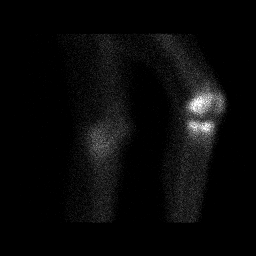

[Series 5: delay · delayed · 2.07mm/px · 1 of 1 slices shown (4 of 4)]
[im 1/1]
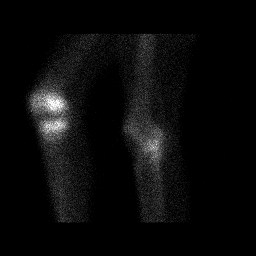

[20 of 20 positions shown; findings below may reference images not displayed]

FINDINGS: Vascular phase: Mildly increased blood flow to the periarticular
regions of the RIGHT knee. Normal blood flow to LEFT knee.

Blood pool phase: Increased blood pool in the periarticular region
of the RIGHT knee. Normal blood pool at LEFT knee.

Delayed phase: Minimal uptake of tracer at the lateral compartment
of the LEFT knee compatible with degenerative changes. Increased
uptake of tracer is seen adjacent to the the tibial and to lesser
degree femoral components of the RIGHT knee prosthesis. Findings are
consistent with aseptic loosening or infection of the RIGHT knee
prosthesis.
IMPRESSION: Mildly increased blood flow and blood pool of tracer to the
periarticular regions of the RIGHT knee consistent with
hyperemia/synovitis.

Abnormal delayed tracer uptake adjacent to the tibial greater than
femoral components of the RIGHT knee prosthesis consistent with
aseptic loosening or infection.

## 2021-09-25 NOTE — Progress Notes (Signed)
?Cardiology Office Note:   ? ?Date:  09/26/2021  ? ?ID:  [name], DOB 1961/04/23, MRN [id number] ? ?PCP:  Harlan Stains, MD  ?Cardiologist:  Sinclair Grooms, MD  ? ?Referring MD: Harlan Stains, MD  ? ?Chief Complaint  ?Patient presents with  ? Advice Only  ?  Vascular disease assessment. ?Mention of carotic plaque in referral records ?Family history CAD  ? Hyperlipidemia  ? ? ?History of Present Illness:   ? ?[name] is a 61 y.o. female with a hx of hyperlipidemia with statin therapy started April 2023 for LDL 189, family h/o CAD, and carotid atherosclerosis referred for CV risk assessment. ? ?In reviewing the primary care records from July 2022, she is noted exertional dyspnea when trying to go up hills but no difficulty playing pickle ball.  No associated discomfort. ? ?Diagnosis of carotid atherosclerosis through a mobile cardiovascular screening program.  States that a formal carotid Doppler study was ordered by primary care and did not reveal any plaque at all. ? ?She has never been a smoker.  She is not diabetic that we know of.  Both her mother and father have vascular disease in their siblings but did not suffer vascular problems themselves. ? ?Her major complaint is dyspnea when walking up stairs or walking up inclines.  No chest discomfort associated.  She has gotten mostly elevated blood pressure recordings at recent office visits, that she says range in the 150/80 range.  No therapy has been offered.  She has gained some weight.  She is not as physically active as she once was because of the dyspnea on exertion. ? ?Of note, is significant lower extremity swelling that occurred while on a cruise.  It resolved after returning home.  Could have been related to the quality of the food that she was eating relative to sodium intake. ? ?Past Medical History:  ?Diagnosis Date  ? Arthritis   ? Asthma   ? DX made  and treated per allergy MD  ? Bunion   ? Cancer [facility])   ? skin cancer  ? GERD  (gastroesophageal reflux disease)   ? Hammer toe   ? Hypothyroidism   ? ? ?Past Surgical History:  ?Procedure Laterality Date  ? BUNIONECTOMY WITH HAMMERTOE RECONSTRUCTION Bilateral   ? CESAREAN SECTION    ? x2  ? COLONOSCOPY  08/28/2011  ? GREAT TOE ARTHRODESIS, INTERPHALANGEAL JOINT    ? NASAL SINUS SURGERY    ? NECK SURGERY    ? plates and screws in  ? POLYPECTOMY    ? ROTATOR CUFF REPAIR Right   ? right  ? TOTAL KNEE ARTHROPLASTY Right   ? ? ?Current Medications: ?Current Meds  ?Medication Sig  ? albuterol (PROVENTIL) (2.5 MG/3ML) 0.083% nebulizer solution Take 2.5 mg by nebulization every 6 (six) hours as needed for wheezing or shortness of breath.  ? amLODipine (NORVASC) 5 MG tablet Take 1 tablet (5 mg total) by mouth daily.  ? atorvastatin (LIPITOR) 20 MG tablet Take 20 mg by mouth daily.  ? Azelastine HCl 137 MCG/SPRAY SOLN Place 2 sprays into both nostrils 2 (two) times daily.  ? cetirizine (ZYRTEC) 10 MG tablet Take 10 mg by mouth daily.  ? EPINEPHrine 0.3 mg/0.3 mL IJ SOAJ injection as directed.  ? fluticasone (FLONASE) 50 MCG/ACT nasal spray Place 2 sprays into the nose daily.  ? ipratropium (ATROVENT) 0.03 % nasal spray as needed.  ? levothyroxine (SYNTHROID) 137 MCG tablet Take 1 tablet by  mouth daily.  ? meloxicam (MOBIC) 15 MG tablet Take 15 mg by mouth daily as needed.  ? nitrofurantoin (MACRODANTIN) 100 MG capsule Take 1 capsule by mouth as needed.  ? NUCALA 100 MG/ML SOAJ   ? PROAIR HFA 108 (90 BASE) MCG/ACT inhaler Inhale 2 puffs into the lungs every 6 (six) hours as needed for wheezing or shortness of breath.   ? sodium chloride (OCEAN) 0.65 % SOLN nasal spray Place 1 spray into both nostrils as needed for congestion. Nasal spray or Neti-pot  ? valACYclovir (VALTREX) 1000 MG tablet as needed.  ? VITAMIN D PO Take by mouth.  ? [DISCONTINUED] levothyroxine (SYNTHROID, LEVOTHROID) 125 MCG tablet Take 125 mcg by mouth daily before breakfast.  ?  ? ?Allergies:   Molds & smuts  ? ?Social History   ? ?Socioeconomic History  ? Marital status: Legally Separated  ?  Spouse name: Not on file  ? Number of children: 2  ? Years of education: Not on file  ? Highest education level: Not on file  ?Occupational History  ? Not on file  ?Tobacco Use  ? Smoking status: Never  ? Smokeless tobacco: Never  ?Vaping Use  ? Vaping Use: Never used  ?Substance and Sexual Activity  ? Alcohol use: Yes  ?  Alcohol/week: 4.0 standard drinks  ?  Types: 4 Glasses of wine per week  ?  Comment: 4 days per wk  ? Drug use: No  ? Sexual activity: Not on file  ?Other Topics Concern  ? Not on file  ?Social History Narrative  ? Not on file  ? ?Social Determinants of Health  ? ?Financial Resource Strain: Not on file  ?Food Insecurity: Not on file  ?Transportation Needs: Not on file  ?Physical Activity: Not on file  ?Stress: Not on file  ?Social Connections: Not on file  ?  ? ?Family History: ?The patient's family history includes Emphysema in her mother; Heart disease in her maternal uncle; Melanoma in her father and paternal uncle. There is no history of Colon cancer, Esophageal cancer, Stomach cancer, Rectal cancer, or Colon polyps. ? ?ROS:   ?Please see the history of present illness.    ?Denies orthopnea, palpitations, ankle edema, syncope, and transient neurological symptoms.  She does have what is felt to be significant asthma.  She has nasal polyps.  She is on Dupixent.  All other systems reviewed and are negative. ? ?EKGs/Labs/Other Studies Reviewed:   ? ?The following studies were reviewed today: ?No new or recent cardiac imaging ?No mention of coronary calcification on a noncardiac chest CT in April 2018.. ? ?EKG:  EKG sinus rhythm, prominent voltage, left atrial abnormality.  Otherwise normal. ? ?Recent Labs: ?No results found for requested labs within last 8760 hours.  ?Recent Lipid Panel ?No results found for: CHOL, TRIG, HDL, CHOLHDL, VLDL, LDLCALC, LDLDIRECT ? ?Physical Exam:   ? ?VS:  BP 130/82   Pulse 60   Ht '5\' 7"'$  (1.702 m)    Wt 158 lb 9.6 oz (71.9 kg)   SpO2 97%   BMI 24.84 kg/m?    ? ?Wt Readings from Last 3 Encounters:  ?09/26/21 158 lb 9.6 oz (71.9 kg)  ?10/15/18 142 lb (64.4 kg)  ?10/24/16 135 lb 12.8 oz (61.6 kg)  ?  ? ?GEN: Healthy appearing.. No acute distress ?HEENT: Normal ?NECK: No JVD. ?LYMPHATICS: No lymphadenopathy ?CARDIAC: No murmur. RRR no gallop, or edema. ?VASCULAR:  Normal Pulses. No bruits. ?RESPIRATORY:  Clear to auscultation without rales, wheezing  or rhonchi  ?ABDOMEN: Soft, non-tender, non-distended, No pulsatile mass, ?MUSCULOSKELETAL: No deformity  ?SKIN: Warm and dry ?NEUROLOGIC:  Alert and oriented x 3 ?PSYCHIATRIC:  Normal affect  ? ?ASSESSMENT:   ? ?1. DOE (dyspnea on exertion)   ?2. Elevated blood pressure reading in office with white coat syndrome, without diagnosis of hypertension   ?3. Hyperlipidemia LDL goal <70   ?4. Family history of early CAD   ?5. Severe persistent asthma without complication   ? ?PLAN:   ? ?In order of problems listed above: ? ?Dyspnea with going up inclines, climbing stairs, hiking uphill.  Progressive over the past 6 to 12 months.  No orthopnea or PND.  Blood pressure is elevated and would assume that if further increases with physical activity and thus could be the genesis of the dyspnea on exertion.  Rule out diastolic dysfunction.  Rule out coronary disease with dyspnea as an anginal equivalent.  2D Doppler echocardiogram will be done. ?Blood pressure is elevated and the Lasix several recordings have been in the range recorded by me.  Start amlodipine 5 mg/day.  2D Doppler echocardiogram to rule out LVH and diastolic dysfunction. ?Coronary calcium score to quantitate burden of atherosclerosis.  I agree with institution of high intensity statin therapy has been done by Dr. Harlan Stains.  Calcium score may help [place] fine-tune target range.  If calcium score is 0, we may not need to further adjust the dose. ?Noted as a risk factor. ?Could be contributing to dyspnea on  exertion.  After blood pressure is controlled if dyspnea does not improve, consider ischemia testing. ? ?Overall education and awareness concerning primary/secondary risk prevention was discussed in detail: LDL less than 70,

## 2021-09-26 ENCOUNTER — Ambulatory Visit: Payer: BC Managed Care – PPO | Admitting: Interventional Cardiology

## 2021-09-26 ENCOUNTER — Encounter: Payer: Self-pay | Admitting: Interventional Cardiology

## 2021-09-26 VITALS — BP 150/72 | HR 60 | Ht 67.0 in | Wt 158.6 lb

## 2021-09-26 DIAGNOSIS — J4550 Severe persistent asthma, uncomplicated: Secondary | ICD-10-CM | POA: Diagnosis not present

## 2021-09-26 DIAGNOSIS — Z8249 Family history of ischemic heart disease and other diseases of the circulatory system: Secondary | ICD-10-CM

## 2021-09-26 DIAGNOSIS — R0609 Other forms of dyspnea: Secondary | ICD-10-CM

## 2021-09-26 DIAGNOSIS — R030 Elevated blood-pressure reading, without diagnosis of hypertension: Secondary | ICD-10-CM | POA: Diagnosis not present

## 2021-09-26 DIAGNOSIS — I6523 Occlusion and stenosis of bilateral carotid arteries: Secondary | ICD-10-CM

## 2021-09-26 DIAGNOSIS — E785 Hyperlipidemia, unspecified: Secondary | ICD-10-CM

## 2021-09-26 MED ORDER — AMLODIPINE BESYLATE 5 MG PO TABS: 5.0000 mg | ORAL_TABLET | Freq: Every day | ORAL | 3 refills | Status: AC

## 2021-09-26 NOTE — Patient Instructions (Signed)
Medication Instructions:  ?Your physician has recommended you make the following change in your medication:  ? ?1) START Amlodipine '5mg'$  daily ? ?*If you need a refill on your cardiac medications before your next appointment, please call your pharmacy* ? ?Lab Work: ?NONE ? ?Testing/Procedures: ?Your physician has requested that you have a Coronary Calcium Score. ? ?Your physician has requested that you have an echocardiogram. Echocardiography is a painless test that uses sound waves to create images of your heart. It provides your doctor with information about the size and shape of your heart and how well your heart?s chambers and valves are working. This procedure takes approximately one hour. There are no restrictions for this procedure. ? ?**Please schedule both of these tests on the same day.** ? ?Follow-Up: ?At [facility] you and your health needs are our priority.  As part of our continuing mission to provide you with exceptional heart care, we have created designated Provider Care Teams.  These Care Teams include your primary Cardiologist (physician) and Advanced Practice Providers (APPs -  Physician Assistants and Nurse Practitioners) who all work together to provide you with the care you need, when you need it. ? ?Your next appointment:   ?6 week(s) ? ?The format for your next appointment:   ?In Person ? ?Provider:   ?Sinclair Grooms, MD { ? ? ?Important Information About Sugar ? ? ? ? ?  ?

## 2021-10-17 ENCOUNTER — Ambulatory Visit
Admission: RE | Admit: 2021-10-17 | Discharge: 2021-10-17 | Disposition: A | Discharge status: Home / Self Care | Payer: Self-pay | Source: Ambulatory Visit | Attending: Interventional Cardiology | Admitting: Interventional Cardiology

## 2021-10-17 ENCOUNTER — Ambulatory Visit (HOSPITAL_COMMUNITY): Payer: BC Managed Care – PPO | Attending: Cardiovascular Disease

## 2021-10-17 DIAGNOSIS — E785 Hyperlipidemia, unspecified: Secondary | ICD-10-CM

## 2021-10-17 DIAGNOSIS — R0609 Other forms of dyspnea: Secondary | ICD-10-CM | POA: Insufficient documentation

## 2021-10-17 DIAGNOSIS — Z8249 Family history of ischemic heart disease and other diseases of the circulatory system: Secondary | ICD-10-CM

## 2021-10-17 LAB — ECHOCARDIOGRAM COMPLETE
Area-P 1/2: 3.17 cm2
Calc EF: 69.3 %
S' Lateral: 2.7 cm
Single Plane A2C EF: 63.1 %
Single Plane A4C EF: 72 %

## 2021-10-25 ENCOUNTER — Other Ambulatory Visit: Payer: Self-pay | Admitting: Family Medicine

## 2021-10-25 DIAGNOSIS — R1010 Upper abdominal pain, unspecified: Secondary | ICD-10-CM

## 2021-10-25 DIAGNOSIS — R140 Abdominal distension (gaseous): Secondary | ICD-10-CM

## 2021-11-19 ENCOUNTER — Encounter: Payer: Self-pay | Admitting: Interventional Cardiology

## 2021-11-19 ENCOUNTER — Ambulatory Visit: Payer: BC Managed Care – PPO | Admitting: Interventional Cardiology

## 2021-11-19 VITALS — BP 120/70 | HR 68 | Ht 67.0 in | Wt 152.8 lb

## 2021-11-19 DIAGNOSIS — R0609 Other forms of dyspnea: Secondary | ICD-10-CM

## 2021-11-19 DIAGNOSIS — E785 Hyperlipidemia, unspecified: Secondary | ICD-10-CM | POA: Diagnosis not present

## 2021-11-19 DIAGNOSIS — Z8249 Family history of ischemic heart disease and other diseases of the circulatory system: Secondary | ICD-10-CM

## 2021-11-19 DIAGNOSIS — R030 Elevated blood-pressure reading, without diagnosis of hypertension: Secondary | ICD-10-CM | POA: Diagnosis not present

## 2021-11-23 ENCOUNTER — Other Ambulatory Visit: Payer: BC Managed Care – PPO

## 2021-12-17 ENCOUNTER — Ambulatory Visit (INDEPENDENT_AMBULATORY_CARE_PROVIDER_SITE_OTHER): Payer: BC Managed Care – PPO

## 2021-12-17 ENCOUNTER — Ambulatory Visit: Payer: BC Managed Care – PPO | Admitting: Podiatry

## 2021-12-17 DIAGNOSIS — R0989 Other specified symptoms and signs involving the circulatory and respiratory systems: Secondary | ICD-10-CM | POA: Diagnosis not present

## 2021-12-17 DIAGNOSIS — R29818 Other symptoms and signs involving the nervous system: Secondary | ICD-10-CM

## 2021-12-17 DIAGNOSIS — M79671 Pain in right foot: Secondary | ICD-10-CM

## 2021-12-17 DIAGNOSIS — M7751 Other enthesopathy of right foot: Secondary | ICD-10-CM

## 2021-12-17 MED ORDER — TRIAMCINOLONE ACETONIDE 10 MG/ML IJ SUSP
10.0000 mg | Freq: Once | INTRAMUSCULAR | Status: AC
  Administered 2021-12-17: 10 mg

## 2021-12-17 NOTE — Progress Notes (Signed)
Subjective:   Patient ID: [name], female   DOB: 61 y.o.   MRN: [id number]   HPI Patient presents stating the second toe right has been sore and the big toe joint is doing great I just wanted it checked.  Second toe left and also give her a small amount of discomfort.  Patient does not smoke likes to be active and this problem has been present for a number of months   Review of Systems  All other systems reviewed and are negative.       Objective:  Physical Exam Vitals and nursing note reviewed.  Constitutional:      Appearance: She is well-developed.  Pulmonary:     Effort: Pulmonary effort is normal.  Musculoskeletal:        General: Normal range of motion.  Skin:    General: Skin is warm.  Neurological:     Mental Status: She is alert.     Neurovascular status was found to be intact muscle strength adequate range of motion within normal records history of implant procedure first MPJ right which is done well with no pain but limited motion.  Patient has inflammation of the second digit right mostly on the lateral side of the inner phalangeal proximal joint slightly on the medial and slight irritation of the second digit left foot also noted.  Good digital perfusion well oriented x3     Assessment:  Inflammatory condition which appears to be more acute than it is chronic with inflammation of the inner phalangeal joint digit to right over left     Plan:  8 NP x-rays reviewed discussed and I went ahead did sterile prep and injected the inner phalangeal joint digit to right 2 mg Dexasone Kenalog 5 mg Xylocaine and applied cushioning to take pressure off the toe.  Reappoint for [place] to recheck again as needed hopefully this will calm the symptoms down  X-rays indicate that there is a previous implant procedure first MPJ which has compressed but still functions well for her with no pathology with a small bone spur on the lateral side of the proximal to phalangeal joint digit to  right

## 2021-12-19 ENCOUNTER — Ambulatory Visit
Admission: RE | Admit: 2021-12-19 | Discharge: 2021-12-19 | Disposition: A | Discharge status: Home / Self Care | Payer: BC Managed Care – PPO | Source: Ambulatory Visit | Attending: Family Medicine | Admitting: Family Medicine

## 2021-12-19 DIAGNOSIS — R1010 Upper abdominal pain, unspecified: Secondary | ICD-10-CM

## 2021-12-19 DIAGNOSIS — R140 Abdominal distension (gaseous): Secondary | ICD-10-CM

## 2021-12-19 MED ORDER — IOPAMIDOL (ISOVUE-300) INJECTION 61%
100.0000 mL | Freq: Once | INTRAVENOUS | Status: AC | PRN
  Administered 2021-12-19: 100 mL via INTRAVENOUS

## 2021-12-20 ENCOUNTER — Other Ambulatory Visit: Payer: Self-pay | Admitting: Podiatry

## 2021-12-20 DIAGNOSIS — R0989 Other specified symptoms and signs involving the circulatory and respiratory systems: Secondary | ICD-10-CM

## 2022-02-11 ENCOUNTER — Ambulatory Visit: Payer: Self-pay

## 2022-02-11 ENCOUNTER — Ambulatory Visit: Payer: BC Managed Care – PPO | Admitting: Family Medicine

## 2022-02-11 VITALS — BP 126/70 | Ht 67.5 in | Wt 148.0 lb

## 2022-02-11 DIAGNOSIS — M7741 Metatarsalgia, right foot: Secondary | ICD-10-CM | POA: Diagnosis not present

## 2022-02-11 DIAGNOSIS — M7742 Metatarsalgia, left foot: Secondary | ICD-10-CM

## 2022-02-11 DIAGNOSIS — M2141 Flat foot [pes planus] (acquired), right foot: Secondary | ICD-10-CM

## 2022-02-11 DIAGNOSIS — R269 Unspecified abnormalities of gait and mobility: Secondary | ICD-10-CM | POA: Diagnosis not present

## 2022-02-11 DIAGNOSIS — M25572 Pain in left ankle and joints of left foot: Secondary | ICD-10-CM

## 2022-02-11 DIAGNOSIS — M2142 Flat foot [pes planus] (acquired), left foot: Secondary | ICD-10-CM

## 2022-02-11 NOTE — Patient Instructions (Signed)
For arthritis: These are the different medications you can take for this: Tylenol '500mg'$  1-2 tabs three times a day for pain. Capsaicin, aspercreme, or biofreeze topically up to four times a day may also help with pain. Some supplements that may help for arthritis: Boswellia extract, curcumin, pycnogenol Aleve 1-2 tabs twice a day with food Cortisone injections are an option in the painful arthritic joint. It's important that you continue to stay active. Arch supports are very important for midfoot arthritis. Heat or ice 15 minutes at a time 3-4 times a day as needed to help with pain.

## 2022-02-12 ENCOUNTER — Encounter: Payer: Self-pay | Admitting: Family Medicine

## 2022-02-12 NOTE — Progress Notes (Signed)
PCP: Harlan Stains, MD  Subjective:   HPI: Patient is a 61 y.o. female here for foot pain, orthotics.  Patient has been seen regularly by podiatry for her foot issues. Main issue currently is pain on dorsal left foot that was very severe about 2 weeks ago and has been improving. Associated swelling, discoloration/?bruising. No history of gout. She has had paring of calluses of her toes by podiatry, had custom orthotics though they were firm and not very comfortable. History of right 1st MTP replacement that then had to be removed, bunionectomy bilaterally with reconstruction, also with hammer toe surgery left 2nd digit.  Past Medical History:  Diagnosis Date   Arthritis    Asthma    DX made  and treated per allergy MD   Bunion    Cancer ([place])    skin cancer   GERD (gastroesophageal reflux disease)    Hammer toe    Hypothyroidism     Current Outpatient Medications on File Prior to Visit  Medication Sig Dispense Refill   albuterol (PROVENTIL) (2.5 MG/3ML) 0.083% nebulizer solution Take 2.5 mg by nebulization every 6 (six) hours as needed for wheezing or shortness of breath.     amLODipine (NORVASC) 5 MG tablet Take 1 tablet (5 mg total) by mouth daily. 90 tablet 3   atorvastatin (LIPITOR) 20 MG tablet Take 20 mg by mouth daily.     Azelastine HCl 137 MCG/SPRAY SOLN Place 2 sprays into both nostrils 2 (two) times daily.     budesonide-formoterol (SYMBICORT) 160-4.5 MCG/ACT inhaler 2 puffs as needed.     cetirizine (ZYRTEC) 10 MG tablet Take 10 mg by mouth daily.     Cholecalciferol (VITAMIN D3) 50 MCG (2000 UT) CAPS Take 1 capsule by mouth daily.     Dupilumab (DUPIXENT) 300 MG/2ML SOPN 2 (two) times a week.     EPINEPHrine 0.3 mg/0.3 mL IJ SOAJ injection as directed.     fluticasone (FLONASE) 50 MCG/ACT nasal spray Place 2 sprays into the nose daily. 16 g 2   ipratropium (ATROVENT) 0.03 % nasal spray as needed.     ipratropium (ATROVENT) 0.03 % nasal spray Place into the nose.      levothyroxine (SYNTHROID) 137 MCG tablet Take 1 tablet by mouth daily.     meloxicam (MOBIC) 15 MG tablet Take 15 mg by mouth daily as needed.     nitrofurantoin (MACRODANTIN) 100 MG capsule Take 1 capsule by mouth as needed.     NUCALA 100 MG/ML SOAJ      omeprazole (PRILOSEC) 40 MG capsule Take 40 mg by mouth every morning. Pt is instructed to take for 30 day.     PROAIR HFA 108 (90 BASE) MCG/ACT inhaler Inhale 2 puffs into the lungs every 6 (six) hours as needed for wheezing or shortness of breath.      sodium chloride (OCEAN) 0.65 % SOLN nasal spray Place 1 spray into both nostrils as needed for congestion. Nasal spray or Neti-pot     valACYclovir (VALTREX) 1000 MG tablet as needed.     VITAMIN D PO Take by mouth.     No current facility-administered medications on file prior to visit.    Past Surgical History:  Procedure Laterality Date   BUNIONECTOMY WITH HAMMERTOE RECONSTRUCTION Bilateral    CESAREAN SECTION     x2   COLONOSCOPY  08/28/2011   GREAT TOE ARTHRODESIS, INTERPHALANGEAL JOINT     NASAL SINUS SURGERY     NECK SURGERY  plates and screws in   POLYPECTOMY     ROTATOR CUFF REPAIR Right    right   TOTAL KNEE ARTHROPLASTY Right     Allergies  Allergen Reactions   Molds & Smuts Cough    BP 126/70   Ht 5' 7.5" (1.715 m)   Wt 148 lb (67.1 kg)   BMI 22.84 kg/m       No data to display              No data to display              Objective:  Physical Exam:  Gen: NAD, comfortable in exam room  Left foot/ankle: Pes planus.  Hallux rigidus.  Transverse arch collapse with bunionette.  No motion of 2nd digit.  No other gross deformity, swelling, ecchymoses FROM ankle without pain Mild TTP dorsal foot at lateral TMT joint.  No other tenderness. Negative ant drawer and negative talar tilt.   Thompsons test negative. NV intact distally.  Right foot/ankle: Pes planus.  Hallux rigidus.  Transverse arch collapse with bunionette.  Great toe  elevated off ground.  No other gross deformity, swelling, ecchymoses FROM ankle without pain No TTP Negative ant drawer and negative talar tilt.   Thompsons test negative. NV intact distally.   Gait:  Notable overpronation.  Limited msk u/s left foot:  Mod-severe arthropathy of TMT joint base 2nd and 3rd metatarsals.  No cortical irregularities of metatarsals or edema overlying cortices.  No ankle joint effusion.  Assessment & Plan:  1. Bilateral foot pain - multifactorial.  Pes planus, transverse arch collapse with metatarsalgia.  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  She would like custom orthotics which were made for her today.  Icing, tylenol, topical medications, supplements, aleve if needed.    Patient was fitted for a : standard, cushioned, semi-rigid orthotic. The orthotic was heated and afterward the patient stood on the orthotic blank positioned on the orthotic stand. The patient was positioned in subtalar neutral position and 10 degrees of ankle dorsiflexion in a weight bearing stance. After completion of molding, a stable base was applied to the orthotic blank. The blank was ground to a stable position for weight bearing. Size: 9 Base: blue med density eva Posting: 1st ray post on left Additional orthotic padding: small metatarsal pads bilaterally - consider change to medium if tolerates well.  Total visit time 50 minutes including documentation.

## 2022-05-22 ENCOUNTER — Encounter: Payer: Self-pay | Admitting: Podiatry

## 2022-05-22 ENCOUNTER — Ambulatory Visit: Payer: BC Managed Care – PPO | Admitting: Podiatry

## 2022-05-22 DIAGNOSIS — M7751 Other enthesopathy of right foot: Secondary | ICD-10-CM

## 2022-05-22 DIAGNOSIS — L84 Corns and callosities: Secondary | ICD-10-CM

## 2022-05-22 MED ORDER — TRIAMCINOLONE ACETONIDE 10 MG/ML IJ SUSP: 10.0000 mg | Freq: Once | INTRAMUSCULAR | Status: AC

## 2022-05-22 NOTE — Progress Notes (Signed)
Subjective:   Patient ID: [name], female   DOB: 61 y.o.   MRN: [id number]   HPI Patient presents with a lot of pain on the inside of the second digit right with inflammation with chronic lesion formation of the second toe big toe right over left foot   ROS      Objective:  Physical Exam  Pain of the inner phalangeal joint right second toe with fluid buildup medial side keratotic lesion x 2     Assessment:  Inflammatory capsulitis of the inner phalangeal joint digit to right with lesion formation bilateral     Plan:  H&P reviewed condition discussed possible surgery for future but at this point did sterile prep and injected the inner phalangeal joint 2 mg dexamethasone 5 mg Xylocaine debrided lesion hallux second toe and applied cushioning.  Reappoint to recheck

## 2022-08-05 ENCOUNTER — Ambulatory Visit: Payer: BC Managed Care – PPO | Admitting: Podiatry

## 2022-08-05 ENCOUNTER — Ambulatory Visit (INDEPENDENT_AMBULATORY_CARE_PROVIDER_SITE_OTHER): Payer: BC Managed Care – PPO

## 2022-08-05 ENCOUNTER — Encounter: Payer: Self-pay | Admitting: Podiatry

## 2022-08-05 DIAGNOSIS — M2041 Other hammer toe(s) (acquired), right foot: Secondary | ICD-10-CM | POA: Diagnosis not present

## 2022-08-05 DIAGNOSIS — M7751 Other enthesopathy of right foot: Secondary | ICD-10-CM

## 2022-08-05 MED ORDER — DOXYCYCLINE HYCLATE 100 MG PO TABS: 100.0000 mg | ORAL_TABLET | Freq: Two times a day (BID) | ORAL | 1 refills | Status: AC

## 2022-08-05 NOTE — Progress Notes (Signed)
Subjective:   Patient ID: [name], female   DOB: 62 y.o.   MRN: [id number]   HPI Patient presents with severe pain second digit right foot and states that it is hard for her to wear shoe gear at this point and she is going to [employer].  States she knows she needs surgery but she needs to wait till she gets back   ROS      Objective:  Physical Exam  Neuro vascular status intact with patient found to have exquisite discomfort over the right second digit around the head of the proximal phalanx with irritation of the tissue and redness but no drainage noted     Assessment:  Probability for chronic digital deformity second digit right foot with pain     Plan:  There is some redness of the toe so as precautionary measure placed on doxycycline and I then discussed arthroplasty procedure educating her on this.  I did dispense surgical shoe and padding to separate the toes and I did explain to her risk of procedure recovery and she is tentatively scheduled for outpatient surgery in the next 3 to 4 weeks.  All questions answered today concerning for surgical correction  X-rays do indicate there is some enlargement around the head of the proximal phalanx digit to right no other pathology noted and also as precautionary measure placed the patient on doxycycline for 10 days twice daily

## 2022-08-08 ENCOUNTER — Telehealth: Payer: Self-pay | Admitting: Urology

## 2022-08-08 NOTE — Telephone Encounter (Signed)
DOS - 08/20/22  HAMMERTOE REPAIR 2ND RIGHT --- [id number]   [employer] [place]   SPOKE WITH LATOYA P. WITH [employer] AND SHE STATED THAT FOR CPT CODE 57846 NO PRIOR AUTH IS REQUIRED.  CALL REF # LATOYA P. 08/07/22 AT 8:29 AM EST

## 2022-08-19 MED ORDER — HYDROCODONE-ACETAMINOPHEN 10-325 MG PO TABS: 1.0000 | ORAL_TABLET | Freq: Three times a day (TID) | ORAL | 0 refills | Status: AC | PRN

## 2022-08-19 NOTE — Addendum Note (Signed)
Addended by: Wallene Huh on: 08/19/2022 04:28 PM   Modules accepted: Orders

## 2022-08-20 ENCOUNTER — Encounter: Payer: Self-pay | Admitting: Podiatry

## 2022-08-20 DIAGNOSIS — M2041 Other hammer toe(s) (acquired), right foot: Secondary | ICD-10-CM | POA: Diagnosis not present

## 2022-08-20 DIAGNOSIS — M25774 Osteophyte, right foot: Secondary | ICD-10-CM | POA: Diagnosis not present

## 2022-08-26 ENCOUNTER — Ambulatory Visit (INDEPENDENT_AMBULATORY_CARE_PROVIDER_SITE_OTHER): Payer: BC Managed Care – PPO

## 2022-08-26 ENCOUNTER — Ambulatory Visit: Payer: BC Managed Care – PPO | Admitting: *Deleted

## 2022-08-26 DIAGNOSIS — M2041 Other hammer toe(s) (acquired), right foot: Secondary | ICD-10-CM

## 2022-08-26 DIAGNOSIS — Z9889 Other specified postprocedural states: Secondary | ICD-10-CM

## 2022-08-26 NOTE — Progress Notes (Signed)
Patient presents today for post op visit # 1 , patient of Dr. Paulla Dolly    POV # 1 DOS 08/20/22 --- HAMMERTOE REPAIR WITH REMOVAL BONE 2ND RIGHT     Patient presents in her surgical shoes. Denies any falls or injury to the foot. Foot is slightly swollen. No signs of infection. No calf pain or shortness of breath. Bandages dry and intact. Incision is intact.    Xrays taken today and reviewed by Dr. Paulla Dolly. He did take a look at the foot today as well.   Foot redressed today and placed back in the boot. Reviewed icing and elevation. Patient will follow up with Dr. Paulla Dolly for POV# 2 in 2 weeks for re-xray and suture removal.

## 2022-09-09 ENCOUNTER — Encounter: Payer: BC Managed Care – PPO | Admitting: Podiatry

## 2022-09-11 ENCOUNTER — Ambulatory Visit (INDEPENDENT_AMBULATORY_CARE_PROVIDER_SITE_OTHER): Payer: BC Managed Care – PPO

## 2022-09-11 ENCOUNTER — Ambulatory Visit (INDEPENDENT_AMBULATORY_CARE_PROVIDER_SITE_OTHER): Payer: BC Managed Care – PPO | Admitting: Podiatry

## 2022-09-11 ENCOUNTER — Encounter: Payer: Self-pay | Admitting: Podiatry

## 2022-09-11 DIAGNOSIS — M2041 Other hammer toe(s) (acquired), right foot: Secondary | ICD-10-CM | POA: Diagnosis not present

## 2022-09-12 NOTE — Progress Notes (Signed)
Subjective:   Patient ID: [name], female   DOB: 62 y.o.   MRN: [id number]   HPI Patient states overall doing well with still some swelling and discomfort if I am on too much   ROS      Objective:  Physical Exam  None neurovascular status intact with patient found to have good digital correction digits 2 3 right stitches intact wound edges well coapted     Assessment:  Doing well post arthroplasty exostectomy     Plan:  Stitches removed wound edges coapted well x-rays confirmed and may gradually return to normal shoes but will take several weeks to get into shoes and will still take several months for complete correction.  Patient is discharged will be seen back as needed  X-rays indicate satisfactory resection of bone good alignment noted

## 2023-08-06 DIAGNOSIS — E039 Hypothyroidism, unspecified: Secondary | ICD-10-CM | POA: Diagnosis not present

## 2023-08-19 DIAGNOSIS — J4540 Moderate persistent asthma, uncomplicated: Secondary | ICD-10-CM | POA: Diagnosis not present

## 2023-08-19 DIAGNOSIS — J3089 Other allergic rhinitis: Secondary | ICD-10-CM | POA: Diagnosis not present

## 2023-08-19 DIAGNOSIS — J338 Other polyp of sinus: Secondary | ICD-10-CM | POA: Diagnosis not present

## 2023-09-30 DIAGNOSIS — D225 Melanocytic nevi of trunk: Secondary | ICD-10-CM | POA: Diagnosis not present

## 2023-09-30 DIAGNOSIS — Z85828 Personal history of other malignant neoplasm of skin: Secondary | ICD-10-CM | POA: Diagnosis not present

## 2023-09-30 DIAGNOSIS — L820 Inflamed seborrheic keratosis: Secondary | ICD-10-CM | POA: Diagnosis not present

## 2023-09-30 DIAGNOSIS — D2271 Melanocytic nevi of right lower limb, including hip: Secondary | ICD-10-CM | POA: Diagnosis not present

## 2023-09-30 DIAGNOSIS — L821 Other seborrheic keratosis: Secondary | ICD-10-CM | POA: Diagnosis not present

## 2023-09-30 DIAGNOSIS — L570 Actinic keratosis: Secondary | ICD-10-CM | POA: Diagnosis not present

## 2023-09-30 DIAGNOSIS — D2272 Melanocytic nevi of left lower limb, including hip: Secondary | ICD-10-CM | POA: Diagnosis not present

## 2023-10-01 DIAGNOSIS — J4550 Severe persistent asthma, uncomplicated: Secondary | ICD-10-CM | POA: Diagnosis not present

## 2023-10-01 DIAGNOSIS — J339 Nasal polyp, unspecified: Secondary | ICD-10-CM | POA: Diagnosis not present

## 2023-10-01 DIAGNOSIS — J324 Chronic pansinusitis: Secondary | ICD-10-CM | POA: Diagnosis not present

## 2023-10-13 DIAGNOSIS — E039 Hypothyroidism, unspecified: Secondary | ICD-10-CM | POA: Diagnosis not present

## 2023-11-06 ENCOUNTER — Other Ambulatory Visit (HOSPITAL_COMMUNITY): Payer: Self-pay | Admitting: Student

## 2023-11-06 DIAGNOSIS — M25562 Pain in left knee: Secondary | ICD-10-CM | POA: Diagnosis not present

## 2023-11-06 DIAGNOSIS — Z96651 Presence of right artificial knee joint: Secondary | ICD-10-CM

## 2023-11-24 ENCOUNTER — Encounter (HOSPITAL_COMMUNITY)
Admission: RE | Admit: 2023-11-24 | Discharge: 2023-11-24 | Disposition: A | Discharge status: Home / Self Care | Payer: Self-pay | Source: Ambulatory Visit | Attending: Student | Admitting: Student

## 2023-11-24 DIAGNOSIS — Z96651 Presence of right artificial knee joint: Secondary | ICD-10-CM | POA: Insufficient documentation

## 2023-11-24 DIAGNOSIS — M1990 Unspecified osteoarthritis, unspecified site: Secondary | ICD-10-CM | POA: Diagnosis not present

## 2023-11-24 MED ORDER — TECHNETIUM TC 99M MEDRONATE IV KIT
20.0000 | PACK | Freq: Once | INTRAVENOUS | Status: AC | PRN
  Administered 2023-11-24: 20.3 via INTRAVENOUS

## 2023-11-26 DIAGNOSIS — I10 Essential (primary) hypertension: Secondary | ICD-10-CM | POA: Diagnosis not present

## 2023-12-25 DIAGNOSIS — J4530 Mild persistent asthma, uncomplicated: Secondary | ICD-10-CM | POA: Diagnosis not present

## 2023-12-25 DIAGNOSIS — I10 Essential (primary) hypertension: Secondary | ICD-10-CM | POA: Diagnosis not present

## 2023-12-25 DIAGNOSIS — E039 Hypothyroidism, unspecified: Secondary | ICD-10-CM | POA: Diagnosis not present

## 2023-12-25 DIAGNOSIS — E785 Hyperlipidemia, unspecified: Secondary | ICD-10-CM | POA: Diagnosis not present

## 2024-01-22 DIAGNOSIS — I10 Essential (primary) hypertension: Secondary | ICD-10-CM | POA: Diagnosis not present

## 2024-01-25 DIAGNOSIS — E785 Hyperlipidemia, unspecified: Secondary | ICD-10-CM | POA: Diagnosis not present

## 2024-01-25 DIAGNOSIS — I10 Essential (primary) hypertension: Secondary | ICD-10-CM | POA: Diagnosis not present

## 2024-01-25 DIAGNOSIS — E039 Hypothyroidism, unspecified: Secondary | ICD-10-CM | POA: Diagnosis not present

## 2024-01-25 DIAGNOSIS — J4530 Mild persistent asthma, uncomplicated: Secondary | ICD-10-CM | POA: Diagnosis not present

## 2024-01-28 DIAGNOSIS — G4733 Obstructive sleep apnea (adult) (pediatric): Secondary | ICD-10-CM | POA: Diagnosis not present

## 2024-01-28 DIAGNOSIS — E7800 Pure hypercholesterolemia, unspecified: Secondary | ICD-10-CM | POA: Diagnosis not present

## 2024-01-28 DIAGNOSIS — K219 Gastro-esophageal reflux disease without esophagitis: Secondary | ICD-10-CM | POA: Diagnosis not present

## 2024-01-28 DIAGNOSIS — Z0000 Encounter for general adult medical examination without abnormal findings: Secondary | ICD-10-CM | POA: Diagnosis not present

## 2024-01-28 DIAGNOSIS — K862 Cyst of pancreas: Secondary | ICD-10-CM | POA: Diagnosis not present

## 2024-01-28 DIAGNOSIS — K760 Fatty (change of) liver, not elsewhere classified: Secondary | ICD-10-CM | POA: Diagnosis not present

## 2024-01-28 DIAGNOSIS — E559 Vitamin D deficiency, unspecified: Secondary | ICD-10-CM | POA: Diagnosis not present

## 2024-01-28 DIAGNOSIS — E039 Hypothyroidism, unspecified: Secondary | ICD-10-CM | POA: Diagnosis not present

## 2024-01-28 DIAGNOSIS — J4530 Mild persistent asthma, uncomplicated: Secondary | ICD-10-CM | POA: Diagnosis not present

## 2024-01-28 DIAGNOSIS — Z23 Encounter for immunization: Secondary | ICD-10-CM | POA: Diagnosis not present

## 2024-01-28 DIAGNOSIS — I6529 Occlusion and stenosis of unspecified carotid artery: Secondary | ICD-10-CM | POA: Diagnosis not present

## 2024-01-28 DIAGNOSIS — I10 Essential (primary) hypertension: Secondary | ICD-10-CM | POA: Diagnosis not present

## 2024-02-24 DIAGNOSIS — E785 Hyperlipidemia, unspecified: Secondary | ICD-10-CM | POA: Diagnosis not present

## 2024-02-24 DIAGNOSIS — E039 Hypothyroidism, unspecified: Secondary | ICD-10-CM | POA: Diagnosis not present

## 2024-02-24 DIAGNOSIS — J4530 Mild persistent asthma, uncomplicated: Secondary | ICD-10-CM | POA: Diagnosis not present

## 2024-02-24 DIAGNOSIS — I10 Essential (primary) hypertension: Secondary | ICD-10-CM | POA: Diagnosis not present

## 2024-03-05 DIAGNOSIS — I10 Essential (primary) hypertension: Secondary | ICD-10-CM | POA: Diagnosis not present

## 2024-03-20 DIAGNOSIS — R051 Acute cough: Secondary | ICD-10-CM | POA: Diagnosis not present

## 2024-03-20 DIAGNOSIS — Z20822 Contact with and (suspected) exposure to covid-19: Secondary | ICD-10-CM | POA: Diagnosis not present

## 2024-03-20 DIAGNOSIS — J029 Acute pharyngitis, unspecified: Secondary | ICD-10-CM | POA: Diagnosis not present

## 2024-03-20 DIAGNOSIS — Z8619 Personal history of other infectious and parasitic diseases: Secondary | ICD-10-CM | POA: Diagnosis not present

## 2024-03-24 ENCOUNTER — Telehealth: Payer: Self-pay

## 2024-03-24 NOTE — Telephone Encounter (Signed)
 Left detailed message for pt on her voicemail regarding recommendations regarding colon recall date. Also let her know if she can obtain a copy of the scan Dr. Abran would be happy to look at the scan.

## 2024-03-24 NOTE — Telephone Encounter (Signed)
-----   Message from Norleen Kiang sent at 03/24/2024  1:28 PM EDT ----- Regarding: Surveillance colonoscopy date.  Total body scan questions [place], This patient of mine is an acquaintance.  She reached out to me via text message regarding follow-up surveillance colonoscopy date and questions regarding something called a Prenuvo full body scan.  Let her know the following: 1.  Her last colonoscopy was June 2020.  The appropriate follow-up examination recommended time was 7 years (as written on her report at that time).  Thus, she will be due for routine follow-up colonoscopy in June 2027.  2.  I could not find something called a Preuvo full body scan.  She had concerns over a diminutive abnormality of the pancreas.  She says that she had the scan done early September.  When you contact her regarding the appropriate colonoscopy follow-up date, please see if she can help you (and me) obtain that scan report for review.  Thanks,  Dr. Kiang  Please convert this message and all further correspondence into a phone note for future reference.

## 2024-03-26 DIAGNOSIS — E039 Hypothyroidism, unspecified: Secondary | ICD-10-CM | POA: Diagnosis not present

## 2024-03-26 DIAGNOSIS — I10 Essential (primary) hypertension: Secondary | ICD-10-CM | POA: Diagnosis not present

## 2024-03-26 DIAGNOSIS — E785 Hyperlipidemia, unspecified: Secondary | ICD-10-CM | POA: Diagnosis not present

## 2024-03-26 DIAGNOSIS — J4530 Mild persistent asthma, uncomplicated: Secondary | ICD-10-CM | POA: Diagnosis not present

## 2024-04-04 DIAGNOSIS — I10 Essential (primary) hypertension: Secondary | ICD-10-CM | POA: Diagnosis not present

## 2024-04-05 DIAGNOSIS — Z85828 Personal history of other malignant neoplasm of skin: Secondary | ICD-10-CM | POA: Diagnosis not present

## 2024-04-05 DIAGNOSIS — C44519 Basal cell carcinoma of skin of other part of trunk: Secondary | ICD-10-CM | POA: Diagnosis not present

## 2024-04-05 DIAGNOSIS — D1801 Hemangioma of skin and subcutaneous tissue: Secondary | ICD-10-CM | POA: Diagnosis not present

## 2024-04-05 DIAGNOSIS — D485 Neoplasm of uncertain behavior of skin: Secondary | ICD-10-CM | POA: Diagnosis not present

## 2024-04-05 DIAGNOSIS — L820 Inflamed seborrheic keratosis: Secondary | ICD-10-CM | POA: Diagnosis not present

## 2024-04-05 DIAGNOSIS — L570 Actinic keratosis: Secondary | ICD-10-CM | POA: Diagnosis not present

## 2024-04-05 DIAGNOSIS — L821 Other seborrheic keratosis: Secondary | ICD-10-CM | POA: Diagnosis not present

## 2024-04-25 DIAGNOSIS — E785 Hyperlipidemia, unspecified: Secondary | ICD-10-CM | POA: Diagnosis not present

## 2024-04-25 DIAGNOSIS — E039 Hypothyroidism, unspecified: Secondary | ICD-10-CM | POA: Diagnosis not present

## 2024-04-25 DIAGNOSIS — J4530 Mild persistent asthma, uncomplicated: Secondary | ICD-10-CM | POA: Diagnosis not present

## 2024-04-25 DIAGNOSIS — I10 Essential (primary) hypertension: Secondary | ICD-10-CM | POA: Diagnosis not present

## 2024-05-06 DIAGNOSIS — L738 Other specified follicular disorders: Secondary | ICD-10-CM | POA: Diagnosis not present

## 2024-05-06 DIAGNOSIS — Z85828 Personal history of other malignant neoplasm of skin: Secondary | ICD-10-CM | POA: Diagnosis not present

## 2024-05-06 DIAGNOSIS — L309 Dermatitis, unspecified: Secondary | ICD-10-CM | POA: Diagnosis not present

## 2024-06-03 ENCOUNTER — Other Ambulatory Visit: Payer: Self-pay | Admitting: Obstetrics and Gynecology

## 2024-06-03 DIAGNOSIS — R928 Other abnormal and inconclusive findings on diagnostic imaging of breast: Secondary | ICD-10-CM

## 2024-06-15 ENCOUNTER — Other Ambulatory Visit: Payer: Self-pay | Admitting: Medical Genetics

## 2024-06-21 ENCOUNTER — Encounter

## 2024-06-21 ENCOUNTER — Other Ambulatory Visit

## 2024-06-23 ENCOUNTER — Inpatient Hospital Stay
Admission: RE | Admit: 2024-06-23 | Discharge: 2024-06-23 | Disposition: A | Source: Ambulatory Visit | Attending: Obstetrics and Gynecology | Admitting: Obstetrics and Gynecology

## 2024-06-23 ENCOUNTER — Inpatient Hospital Stay
Admission: RE | Admit: 2024-06-23 | Discharge: 2024-06-23 | Attending: Obstetrics and Gynecology | Admitting: Obstetrics and Gynecology

## 2024-06-23 DIAGNOSIS — R928 Other abnormal and inconclusive findings on diagnostic imaging of breast: Secondary | ICD-10-CM

## 2024-06-24 ENCOUNTER — Other Ambulatory Visit: Payer: Self-pay | Admitting: Family Medicine

## 2024-06-24 DIAGNOSIS — K862 Cyst of pancreas: Secondary | ICD-10-CM

## 2024-08-09 ENCOUNTER — Other Ambulatory Visit

## 2024-08-16 ENCOUNTER — Other Ambulatory Visit
